# Patient Record
Sex: Female | Born: 1940
Health system: Southern US, Community
[De-identification: ages and names within clinical notes are randomized; demographics above are authoritative.]

## PROBLEM LIST (undated history)

## (undated) DIAGNOSIS — E079 Disorder of thyroid, unspecified: Secondary | ICD-10-CM

## (undated) DIAGNOSIS — G309 Alzheimer's disease, unspecified: Secondary | ICD-10-CM

## (undated) DIAGNOSIS — E119 Type 2 diabetes mellitus without complications: Secondary | ICD-10-CM

## (undated) DIAGNOSIS — E0500 Thyrotoxicosis with diffuse goiter without thyrotoxic crisis or storm: Secondary | ICD-10-CM

## (undated) DIAGNOSIS — F0280 Dementia in other diseases classified elsewhere without behavioral disturbance: Secondary | ICD-10-CM

## (undated) HISTORY — DX: Thyrotoxicosis with diffuse goiter without thyrotoxic crisis or storm: E05.00

## (undated) HISTORY — PX: ABDOMINAL HYSTERECTOMY: SHX81

---

## 1997-08-27 ENCOUNTER — Other Ambulatory Visit: Admission: RE | Admit: 1997-08-27 | Discharge: 1997-08-27 | Payer: Self-pay | Admitting: Obstetrics and Gynecology

## 1998-08-18 ENCOUNTER — Other Ambulatory Visit: Admission: RE | Admit: 1998-08-18 | Discharge: 1998-08-18 | Payer: Self-pay | Admitting: Obstetrics and Gynecology

## 1999-11-01 ENCOUNTER — Other Ambulatory Visit: Admission: RE | Admit: 1999-11-01 | Discharge: 1999-11-01 | Payer: Self-pay | Admitting: Obstetrics and Gynecology

## 2000-11-08 ENCOUNTER — Other Ambulatory Visit: Admission: RE | Admit: 2000-11-08 | Discharge: 2000-11-08 | Payer: Self-pay | Admitting: Obstetrics and Gynecology

## 2001-01-02 HISTORY — PX: APPENDECTOMY: SHX54

## 2001-11-04 ENCOUNTER — Other Ambulatory Visit: Admission: RE | Admit: 2001-11-04 | Discharge: 2001-11-04 | Payer: Self-pay | Admitting: Obstetrics and Gynecology

## 2002-01-24 ENCOUNTER — Encounter: Payer: Self-pay | Admitting: Emergency Medicine

## 2002-01-25 ENCOUNTER — Inpatient Hospital Stay (HOSPITAL_COMMUNITY): Admission: EM | Admit: 2002-01-25 | Discharge: 2002-02-06 | Payer: Self-pay | Admitting: Emergency Medicine

## 2002-01-25 ENCOUNTER — Encounter (INDEPENDENT_AMBULATORY_CARE_PROVIDER_SITE_OTHER): Payer: Self-pay

## 2002-01-25 ENCOUNTER — Encounter: Payer: Self-pay | Admitting: Emergency Medicine

## 2002-01-28 ENCOUNTER — Encounter: Payer: Self-pay | Admitting: Surgery

## 2002-01-28 ENCOUNTER — Encounter: Payer: Self-pay | Admitting: General Surgery

## 2002-01-31 ENCOUNTER — Encounter: Payer: Self-pay | Admitting: Surgery

## 2002-02-03 ENCOUNTER — Encounter: Payer: Self-pay | Admitting: Surgery

## 2002-02-05 ENCOUNTER — Encounter: Payer: Self-pay | Admitting: Surgery

## 2002-12-01 ENCOUNTER — Other Ambulatory Visit: Admission: RE | Admit: 2002-12-01 | Discharge: 2002-12-01 | Payer: Self-pay | Admitting: Obstetrics and Gynecology

## 2003-12-16 ENCOUNTER — Other Ambulatory Visit: Admission: RE | Admit: 2003-12-16 | Discharge: 2003-12-16 | Payer: Self-pay | Admitting: Obstetrics and Gynecology

## 2004-10-31 ENCOUNTER — Encounter: Admission: RE | Admit: 2004-10-31 | Discharge: 2004-10-31 | Payer: Self-pay | Admitting: Obstetrics and Gynecology

## 2005-03-06 ENCOUNTER — Other Ambulatory Visit: Admission: RE | Admit: 2005-03-06 | Discharge: 2005-03-06 | Payer: Self-pay | Admitting: Obstetrics and Gynecology

## 2006-08-08 ENCOUNTER — Ambulatory Visit (HOSPITAL_COMMUNITY): Admission: RE | Admit: 2006-08-08 | Discharge: 2006-08-08 | Payer: Self-pay | Admitting: Gastroenterology

## 2007-11-25 ENCOUNTER — Ambulatory Visit: Payer: Self-pay | Admitting: Cardiovascular Disease

## 2007-12-11 ENCOUNTER — Ambulatory Visit: Payer: Self-pay

## 2007-12-11 ENCOUNTER — Encounter: Payer: Self-pay | Admitting: Cardiovascular Disease

## 2010-01-22 ENCOUNTER — Encounter: Payer: Self-pay | Admitting: Obstetrics and Gynecology

## 2010-05-17 NOTE — Op Note (Signed)
NAMEGERICA, [name]                ACCOUNT NO.:  [id number]   MEDICAL RECORD NO.:  [id number]          PATIENT TYPE:  AMB   LOCATION:  ENDO                         FACILITY:  [facility]   PHYSICIAN:  Anselmo Rod, M.D.  DATE OF BIRTH:  Nov 13, 1940   DATE OF PROCEDURE:  08/08/2006  DATE OF DISCHARGE:                               OPERATIVE REPORT   PROCEDURE PERFORMED:  Screening colonoscopy.   ENDOSCOPIST:  Anselmo Rod, M.D.   INSTRUMENT USED:  Pentax video colonoscope.   INDICATIONS FOR PROCEDURE:  Patient is a 70 year old white female, who  underwent screening colonoscopy to rule out colonic polyps, masses, etc.   PREPROCEDURE PREPARATION:  Informed consent was obtained from the  patient. Patient fasted for eight hours prior to the procedure and  prepped with two Dulcolax pills, a bottle of magnesium citrate and a  gallon of NuLYTELY the night prior to the procedure.  Risks and benefits  of the procedure, including a 10% miss rate of cancer and polyp, were  discussed with the patient, as well.   PHYSICAL EXAM:  Patient had stable vital signs.  NECK:  Supple.  CHEST:  Clear to auscultation.  S1, S2 regular.  ABDOMEN:  Soft with normal bowel sounds.   DESCRIPTION OF PROCEDURE:  The patient was placed in left lateral  decubitus position, sedated with 75 micrograms of Fentanyl and 4 mg of  Versed given intravenously in slow, incremental doses. Once the patient  was adequately sedated and maintained on low-flow oxygen, continuous  cardiac monitoring, the Pentax video colonoscope was advanced from the  rectum to the cecum.  There was some residual stool in the colon.  Multiple washes were done. The appendiceal orifice and ileocecal valve  were visualized and photographed.  The patient's position was changed  from the left lateral to the supine position with gentle application of  abdominal pressure to reach the terminal ileum. The terminal ileum  appeared healthy and without  lesions.  Small internal hemorrhoids were  seen on retroflexion and a small external hemorrhoid was seen on anal  inspection.  A few scattered diverticula were noted.  No masses or  polyps were identified.  The patient tolerated the procedure well,  without complications.   IMPRESSION:  1. Small external hemorrhoid.  2. Small internal hemorrhoids.  3. Few scattered diverticula.  4. Otherwise normal exam, no masses or polyps seen.   RECOMMENDATIONS:  1. Continue high-fiber diet with liberal fluid intake.  2. Repeat colonoscopy in the next ten years.  3. If the patient has any abnormal symptoms in the interim, she should      contact the office immediately for further recommendations.  4. Outpatient followup as need arises in the future.      Anselmo Rod, M.D.  Electronically Signed     JNM/MEDQ  D:  08/08/2006  T:  08/08/2006  Job:  [id number]   cc:   Marcelino Duster L. Vincente Poli, M.D.  Fax: [phone]   [name], M.D.  Fax: [phone]

## 2010-05-17 NOTE — Assessment & Plan Note (Signed)
[facility]                            CARDIOLOGY OFFICE NOTE   NAME:[name] [name] SANTOYO                       MRN:          [id number]  DATE:11/25/2007                            DOB:          July 04, 1940    [name] is a pleasant 70 year old patient referred by Dr. Talmage Nap for  chest pain and shortness of breath.  I have seen the patient back before  in 2002.  She is a longstanding diabetic with hypertension and  hypercholesterolemia.   She has been having exertional chest pain and shortness of breath, it  has been going on for few months.  She initially get the dyspnea.   The dyspnea is clearly exertional.  There is no resting shortness of  breath.  She quit smoking over 20 years ago and does not carry a  diagnosis of lung disease.  She has not had a cough, pleuritic pain, or  sputum production.  There is no history of DVT or PE.  Sometimes, if she  walks she gets a squeezing-type chest pain in her chest after the  dyspnea starts.   It tends to happen when she goes up-hill.   She has not had any prolonged episodes or resting pain.   The patient has actually done quite well with her diabetes.  Byetta has  been a godsend.  She has lost about 50 pounds and her hemoglobin A1c is  down to 5.6.   Despite having her sugar under better control and losing this amount of  weight, she has been having her exertional dyspnea and pressure.  I told  her that with these symptoms she clearly needs a followup stress  Myoview.  Her last study was of good quality and I think it will be a  good baseline.   Review of systems otherwise negative.  In particular, she has not had  diaphoresis, syncope, or palpitations.   Her current medications include metformin 500 b.i.d., Byetta 10 a day,  lisinopril 5 a day, lovastatin 40 a day, an aspirin a day.   She is allergic to ASPIRIN and PENICILLIN.   The patient's past medical history is otherwise remarkable for  diabetes,  hypercholesterolemia, hypertension.  She has had left knee surgery  before.   She is happily married.  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  She otherwise tries to walk on a regular basis.  She does not smoke or drink.   Her family history is noncontributory.   Her exam is remarkable for an overweight short white female in no  distress.  Affect is appropriate.  Her blood pressure is 130/80, pulse  70 and regular, respiratory rate 14 afebrile.  HEENT, unremarkable.  Carotids are normal without bruit.  No lymphadenopathy, thyromegaly, or  JVP elevation.  Lungs are clear, good diaphragmatic motion.  No  wheezing.  S1 and S2 with normal heart sounds.  PMI normal.  Abdomen is  benign.  Bowel sounds positive.  No AAA, no tenderness, no bruit, no  hepatosplenomegaly, no hepatojugular reflux.  No tenderness.  Distal  pulses are intact.  No edema.  Neuro nonfocal.  Skin, warm and dry.  No  muscular weakness.   EKG shows sinus rhythm with some minor nonspecific T-wave changes in the  inferior leads.   IMPRESSION:  1. Multiple coronary risk factors with exertional pressure and      shortness of breath.  Followup stress Myoview.  2. Exertional dyspnea in the setting of multiple coronary risk factors      and hypertension.  Check 2-D echocardiogram.  Assess left      ventricular thickness and left ventricular mass.  Rule out      diastolic or systolic dysfunction.  3. Hypercholesterolemia.  Her LDL cholesterol was 86 with normal LFTs.      Continue lovastatin.  Lipid and liver profile in 6 months.  4. Hypertension, currently well controlled.  Continue low-dose      lisinopril.  5. Diabetes, very well controlled with Byetta.  Continue to do b.i.d.      injections.  Follow up with Dr. Talmage Nap.   Further recommendations will be based on results of her stress test and  echo.  Hopefully, they will be normal as they were in 2002.     Noralyn Pick. Eden Emms,  MD, [facility]  Electronically Signed    PCN/MedQ  DD: 11/25/2007  DT: 11/26/2007  Job #: [id number]   cc:   [name], M.D.

## 2010-05-20 NOTE — Consult Note (Signed)
NAMEMIRABEL, AHLGREN                          ACCOUNT NO.:  [id number]   MEDICAL RECORD NO.:  [id number]                   PATIENT TYPE:  INP   LOCATION:  0353                                 FACILITY:  [facility]   PHYSICIAN:  Reather Littler, M.D.                    DATE OF BIRTH:  1940/03/22   DATE OF CONSULTATION:  02/01/2002  DATE OF DISCHARGE:                                   CONSULTATION   REASON FOR CONSULTATION:  Diabetes.   HISTORY OF PRESENT ILLNESS:  This is a 70 year old Caucasian female admitted  on 01/24/02, for acute appendicitis.  The patient apparently has not been  told to have diabetes in the past, and her blood sugar on admission was 178.  Repeat blood sugar on 01/26/02, was 198.  On 01/31/02, was 318.  The patient  did receive a single bolus of Solu-Medrol on 01/28/02, of 125 mg.  Subsequently, has not had any further steroids.  She was also changed from  Cipro to Tequin on 01/28/02.   The patient has had continued problems with mild fever and leukocytosis, and  did have a CT scan of her abdomen on 02/01/02, with drainage of fluid  collection.   CURRENT MEDICATIONS:  1. Estradiol.  2. Levothyroxine 0.1 mg.  3. Protonix 40 mg b.i.d.  4. Albuterol.  5. Cough medications.  6. Intravenous Flagyl and Tequin.  7. Pain medications p.r.n.   ALLERGIES:  PENICILLIN.   PAST MEDICAL HISTORY:  1. The patient has a history of hypothyroidism secondary to radioactive     iodine ablation.  Her office history and examination is attached to the     chart.  The patient has been on Synthroid replacement with recent normal     levels.  2. History of mild hypertension, taking Norvasc.  3. Hysterectomy 15 years ago, and is maintained on Estradiol.   REVIEW OF SYMPTOMS:  Essentially unremarkable apart from above history.   PHYSICAL EXAMINATION:  GENERAL:  The patient was somewhat lethargic on  initial evaluation.  Was not cooperative for full examination.  HEENT:  Grossly normal.  NECK:  No thyroid enlargement, no lymphadenopathy.  HEART:  Heart sounds normal.  LUNGS:  Clear.  ABDOMEN:  Mildly distended.  Bandage present and no obvious tenderness  present.  Bowel sounds markedly decreased.  EXTREMITIES:  Normal.  Pedal pulses appeared decreased.   ASSESSMENT:  The patient does have mild diabetes with admission blood sugars  near 200.  However, it is difficult to assess whether she has true diabetes  as she had recent steroids, and her blood sugar possible elevated as a  consequence of this.  There is also a remote chance that Tequin may have  increased her blood sugars.   PLAN:  We will start intravenous insulin, continue IV hydration, and  eventually switch her to oral agents.  Her Synthroid will be continued  as  such.                                                 Reather Littler, M.D.    AK/MEDQ  D:  02/01/2002  T:  02/01/2002  Job:  [id number]   cc:   Velora Heckler, M.D.  [address] [place]  [place] [address]  Fax: [phone]   Alfonse Alpers. Dagoberto Ligas, M.D.  [address], [address]  [place]  [place] [address]  Fax: [phone]

## 2010-05-20 NOTE — Discharge Summary (Signed)
[name], [name]                          ACCOUNT NO.:  [id number]   MEDICAL RECORD NO.:  [id number]                   PATIENT TYPE:  INP   LOCATION:  0353                                 FACILITY:  [facility]   PHYSICIAN:  Velora Heckler, M.D.                DATE OF BIRTH:  [birth year]   DATE OF ADMISSION:  01/24/2002  DATE OF DISCHARGE:  02/06/2002                                 DISCHARGE SUMMARY   REASON FOR ADMISSION:  Acute appendicitis.   BRIEF HISTORY:  The patient is a 70 year old white female from [place],  [place].  Presents to the emergency department at [facility] with three day history of abdominal pain localized to the  right lower quadrant associated with nausea and vomiting.  The patient was  noted to have an elevated white blood cell count of 14,000.  CT scan abdomen  and pelvis showed findings consistent with acute appendicitis.   HOSPITAL COURSE:  The patient was seen in the emergency department in  consultation by general surgery.  She was admitted on the general surgical  service.  The patient was prepared and taken directly to the operating room  where she underwent laparoscopic appendectomy.  The patient was noted to  have moderate intra-abdominal fluid, peritonitis, and a gangrenous appendix.  She was treated postoperatively with intravenous antibiotics.  Postoperative  course was complicated by respiratory distress.  She was transferred to the  stepdown unit and required supplemental oxygen.  She was seen in  consultation by Casimiro Needle B. Sherene Sires, M.D. [facility] from the pulmonary medical  service.  She was treated with intravenous steroids and a change in her  antibiotic therapy as well as bronchodilators.  The patient improved.  However, she maintained a low grade fever and leukocytosis.  She had poor  appetite, persistent abdominal discomfort.  The patient underwent CT  scanning of the abdomen and pelvis to rule out abscess.  She was  found to  have a significant pericolonic fluid collection which was percutaneously  drained with CT guidance.  The patient continued on intravenous antibiotics.  Also complicating her postoperative recovery was new onset diabetes.  She  was seen in consultation by Reather Littler, M.D. and her primary physician,  Alfonse Alpers. Dagoberto Ligas, M.D.  She was treated with oral hypoglycemics and  supplemental insulin.  Over the ensuing days the patient gradually improved.  She remained on intravenous antibiotics throughout her hospital course.  She  was converted late in the course to oral Cipro.  Sequential CT scanning  showed resolution of the intra-abdominal fluid collection.  Cultures of the  fluid failed to grow any significant organisms.  Therefore, the drain was  removed on February 4.  The patient was converted to oral hypoglycemics.  She was prepared for discharge home on February 06, 2002.   DISCHARGE PLAN:  The patient is discharged home February 06, 2002.  DISCHARGE MEDICATIONS:  1. Estradiol.  2. Synthroid.  3. Nystatin.  4. Glucotrol.  5. Cipro.  6. Actos.  7. Vicodin as needed for pain.  8. Magic mouthwash.   FOLLOW UP:  The patient will be seen back in my office at [facility] in 10 days for a wound check.  The patient will be seen back at the  office of Alfonse Alpers. [name], M.D. in five days for follow-up of her  diabetes.   FINAL DIAGNOSES:  1. Acute appendicitis with peritonitis.  2. New onset diabetes.  3. Postoperative fluid collection requiring percutaneous drainage.   CONDITION ON DISCHARGE:  Improved.                                               Velora Heckler, M.D.    TMG/MEDQ  D:  02/06/2002  T:  02/06/2002  Job:  [id number]   cc:   Alfonse Alpers. Dagoberto Ligas, M.D.  [address]  [place]  [place] [address]  Fax: [phone]   Stann Mainland. Vincente Poli, M.D.  [address], [address] [place]  [place] [address]  Fax: [phone]

## 2010-05-20 NOTE — H&P (Signed)
[name], [name]                          ACCOUNT NO.:  [id number]   MEDICAL RECORD NO.:  [id number]                   PATIENT TYPE:  INP   LOCATION:  0346                                 FACILITY:  [facility]   PHYSICIAN:  Velora Heckler, M.D.                DATE OF BIRTH:  03/30/1940   DATE OF ADMISSION:  01/24/2002  DATE OF DISCHARGE:                                HISTORY & PHYSICAL   PRIMARY CARE PHYSICIAN:  Alfonse Alpers. Dagoberto Ligas, M.D.   GYNECOLOGISTStann Mainland. Vincente Poli, M.D.   REFERRING PHYSICIAN:  Markham Jordan L. Effie Shy, M.D., emergency department at [facility].   CHIEF COMPLAINT:  Acute appendicitis.   HISTORY OF PRESENT ILLNESS:  The patient is a 70 year old white female from  [place], [place], presents with a three day history of abdominal pain,  localizing to the right lower quadrant associated with nausea and vomiting.  The patient developed initially diffuse abdominal pain.  Her family thought  that she had pulled a muscle with her exercise routine.  However, pain  persisted and localized to the right lower quadrant.  The patient developed  fever to 102.  She did have onset of nausea and vomiting on 01/24/02.  The  patient was brought to the emergency department at [facility] in  the evening of 01/24/02.  The patient was assessed by Dr. Gray Bernhardt.  Laboratory studies showed an elevated white blood cell count at 14,000 with  a left shift.  CT scan of abdomen and pelvis was obtained which demonstrated  findings consistent with acute appendicitis.  General surgery was consulted,  and the patient is admitted at this time for further evaluation and  management.   PAST MEDICAL HISTORY:  1. Status post total vaginal hysterectomy.  2. History of Graves disease, treated with radioactive iodine.  3. History of hypertension.   MEDICATIONS:  1. Synthroid.  2. Estrace.  3. Norvasc.   ALLERGIES:  1. PENICILLIN, causing itching.  2. ASPIRIN, causing rash.   SOCIAL HISTORY:  The patient lives in [place], [place].  She is accompanied by  her husband.  She denies tobacco use, having quit approximately 30 years  ago.  She denies alcohol use.   FAMILY HISTORY:  No adverse reactions to surgery or anesthesia.   REVIEW OF SYMPTOMS:  A 15 system review.  Discussed with the patient and  family without significant positives except as noted above.   PHYSICAL EXAMINATION:  GENERAL:  A 70 year old moderately obese white female  on a stretcher in the emergency department, sedated.  VITAL SIGNS:  Temperature 102.8, pulse 128, respirations 24, blood pressure  154/76, O2 saturation 98% on room air.  HEENT:  Normocephalic.  Sclerae are clear.  Dentition is fair.  Voice  quality is normal.  NECK:  Anterior examination of the neck shows it to be symmetric.  Palpation  reveals no mass.  There  is no anterior or posterior cervical adenopathy.  There are no supraclavicular masses.  LUNGS:  Clear to auscultation.  CARDIAC:  Regular rate and rhythm without murmur.  ABDOMEN:  Soft, obese, quiet.  There are no obvious surgical wounds.  There  is tenderness to palpation diffusely across the abdominal wall with maximum  in the right lower quadrant.  There is tenderness to percussion particularly  at McBurney's point.  There is voluntary guarding.  There is rebound  tenderness in the right lower quadrant.  EXTREMITIES:  Nontender with 1+ edema at the ankles.  NEUROLOGIC:  The patient is alert and oriented without focal neurologic  deficits.   LABORATORY DATA:  CT scan of the abdomen and pelvis read by Dr. Loralie Champagne shows dilated appendix with surrounding inflammation consistent  with acute appendicitis.  Laboratory studies show a white blood cell count  of 14.7, hemoglobin 12.7, hematocrit 37%, platelet count 241,000.  Differential shows 82% neutrophils, 11% lymphocytes, 6% monocytes.  Urinalysis was benign.  Amylase is 60, lipase is 20.  Chemistry profile is   within normal limits with the exception of glucose of 178.  Liver function  tests are normal.  Prothrombin time is normal at 13.2, PTT is normal at 30  seconds.   DIAGNOSIS:  Acute appendicitis.   PLAN:  I discussed at length with the patient and her family the indications  for surgery.  I explained the technique of laparoscopic appendectomy versus  conversion to an open appendectomy.  We discussed the hospital course to be  expected.  We discussed potential complications including conversion to an  open procedure, and possibility of wound infection or intra-abdominal  abscess formation.  The patient and her family understand and wish to  proceed.  We will make arrangements with the operating room here at [facility] on an urgent basis.                                                 Velora Heckler, M.D.    TMG/MEDQ  D:  01/25/2002  T:  01/25/2002  Job:  [id number]   cc:   Alfonse Alpers. Dagoberto Ligas, M.D.  [address], [address]  [place]  [place] [address]  Fax: [phone]   Stann Mainland. Vincente Poli, M.D.  [address], [address] [place]  [place] [address]  Fax: [phone]

## 2010-05-20 NOTE — Op Note (Signed)
NAMEKEARY, [name]                          ACCOUNT NO.:  [id number]   MEDICAL RECORD NO.:  [id number]                   PATIENT TYPE:  INP   LOCATION:  0346                                 FACILITY:  [facility]   PHYSICIAN:  Velora Heckler, M.D.                DATE OF BIRTH:  01-11-1940   DATE OF PROCEDURE:  01/25/2002  DATE OF DISCHARGE:                                 OPERATIVE REPORT   PREOPERATIVE DIAGNOSIS:  Acute appendicitis.   POSTOPERATIVE DIAGNOSIS:  Acute appendicitis.   PROCEDURE:  Laparoscopic cholecystectomy.   SURGEON:  Velora Heckler, M.D.   ANESTHESIA:  General.   ESTIMATED BLOOD LOSS:  Minimal.   PREPARATION:  Betadine.   INDICATIONS:  The patient is a 70 year old white female who presents to the  emergency department with a three day history of abdominal pain localized to  the right lower quadrant.  The patient had fever to 102 degrees.  White  blood cell count was elevated at 14,000.  CT scan of the abdomen  demonstrated consistent with acute appendicitis.  The patient was prepared  and now brought to the operating room for appendectomy.   DESCRIPTION OF PROCEDURE:  The procedure was done in OR #1 at the [facility].  The patient was brought to the operating room and  placed in the supine position on the operating room table.  Following the  administration of general anesthesia, the patient was prepped and draped in  the usual strict aseptic fashion.  After ascertaining that an adequate level  of anesthesia had been obtained, a supraumbilical incision was made with the  #15 blade.  Dissection was carried down to the fascia.  The fascia was  incised in the midline and the peritoneal cavity was entered cautiously.  A  0 Vicryl pursestring suture was placed in the fascia.  A Hasson cannula was  introduced under direct vision and secured with a pursestring suture.  The  abdomen was insufflated with carbon dioxide.  The laparoscope was  introduced  under direct vision and the abdomen explored.  There is a large phlegmon in  the right lower quadrant.  There are adhesions of small bowel.  There is  purulent fluid visible.  There is moderate fluid in the pelvis.  Operative  port is placed in the right upper quadrant.  Using a Glassman clamp, loops  of small bowel loops of small bowel were gently dissected away from the  inflammatory mass.  The cecum is exposed.  The tip of what appears to be a  necrotic appendix is identified.  A second operative port is placed in the  left lower quadrant.  The terminal ileum is identified.  A necrotic piece of  adipose tissue overlying the appendix is dissected away with the harmonic  scalpel.  The base of the appendix was visualized.  A window was created  behind  the base of the appendix.  An Endo-GIA stapler was inserted and used  to transect the base of the appendix at the cecal wall.  The appendiceal  mesentery was then taken down using the Harmonic scalpel for hemostasis.  The entire appendix was excised and placed into an EndoCatch bag.  A small  piece of necrotic adipose tissue was also completely excised and placed into  the EndoCatch bag.  Specimen was withdrawn through the left lower quadrant  port.  The right lower quadrant was then copiously irrigated with warm  saline which was evacuated.  The pelvis was irrigated with warm saline which  was evacuated.  Good hemostasis was noted.  The ports were removed under  direct vision.  Pneumoperitoneum was released.  Ports were removed.  0  Vicryl pursestring sutures were tied securely.  The port sites were  anesthetized with local anesthetic.  All three wounds were closed with  interrupted 4-0 Vicryl subcuticular sutures.  The wounds were washed and  dried, and Benzoin and Steri-Strips were applied.  Sterile gauze dressings  were applied.  The patient was awakened from anesthesia and brought to the  recovery room in stable condition.   The patient tolerated the procedure  well.                                                Velora Heckler, M.D.    TMG/MEDQ  D:  01/25/2002  T:  01/25/2002  Job:  [id number]   cc:   Alfonse Alpers. Dagoberto Ligas, M.D.  [address], [address]  [place]  [place] [address]  Fax: [phone]   Stann Mainland. Vincente Poli, M.D.  [address] [place]  [place] [address]  Fax: [phone]   Jonita Albee, M.D.  [facility]  [address]  [place]  [place] [address]  Fax: [phone]

## 2010-08-31 ENCOUNTER — Other Ambulatory Visit: Payer: Self-pay | Admitting: Neurology

## 2010-08-31 DIAGNOSIS — F068 Other specified mental disorders due to known physiological condition: Secondary | ICD-10-CM

## 2010-09-02 ENCOUNTER — Ambulatory Visit
Admission: RE | Admit: 2010-09-02 | Discharge: 2010-09-02 | Disposition: A | Discharge status: Home / Self Care | Payer: Medicare Other | Source: Ambulatory Visit | Attending: Neurology | Admitting: Neurology

## 2010-09-02 DIAGNOSIS — F068 Other specified mental disorders due to known physiological condition: Secondary | ICD-10-CM

## 2011-07-26 ENCOUNTER — Other Ambulatory Visit: Payer: Self-pay | Admitting: Obstetrics and Gynecology

## 2012-07-29 ENCOUNTER — Other Ambulatory Visit: Payer: Self-pay | Admitting: Obstetrics and Gynecology

## 2014-01-06 DIAGNOSIS — E1121 Type 2 diabetes mellitus with diabetic nephropathy: Secondary | ICD-10-CM | POA: Diagnosis not present

## 2014-01-06 DIAGNOSIS — I10 Essential (primary) hypertension: Secondary | ICD-10-CM | POA: Diagnosis not present

## 2014-01-06 DIAGNOSIS — E538 Deficiency of other specified B group vitamins: Secondary | ICD-10-CM | POA: Diagnosis not present

## 2014-01-13 DIAGNOSIS — E1165 Type 2 diabetes mellitus with hyperglycemia: Secondary | ICD-10-CM | POA: Diagnosis not present

## 2014-01-13 DIAGNOSIS — E780 Pure hypercholesterolemia: Secondary | ICD-10-CM | POA: Diagnosis not present

## 2014-01-13 DIAGNOSIS — E039 Hypothyroidism, unspecified: Secondary | ICD-10-CM | POA: Diagnosis not present

## 2014-01-13 DIAGNOSIS — E1121 Type 2 diabetes mellitus with diabetic nephropathy: Secondary | ICD-10-CM | POA: Diagnosis not present

## 2014-02-20 DIAGNOSIS — H2513 Age-related nuclear cataract, bilateral: Secondary | ICD-10-CM | POA: Diagnosis not present

## 2014-02-20 DIAGNOSIS — E119 Type 2 diabetes mellitus without complications: Secondary | ICD-10-CM | POA: Diagnosis not present

## 2014-02-20 DIAGNOSIS — H524 Presbyopia: Secondary | ICD-10-CM | POA: Diagnosis not present

## 2014-03-24 DIAGNOSIS — E1165 Type 2 diabetes mellitus with hyperglycemia: Secondary | ICD-10-CM | POA: Diagnosis not present

## 2014-03-24 DIAGNOSIS — E039 Hypothyroidism, unspecified: Secondary | ICD-10-CM | POA: Diagnosis not present

## 2014-03-24 DIAGNOSIS — E780 Pure hypercholesterolemia: Secondary | ICD-10-CM | POA: Diagnosis not present

## 2014-03-24 DIAGNOSIS — I10 Essential (primary) hypertension: Secondary | ICD-10-CM | POA: Diagnosis not present

## 2014-03-26 DIAGNOSIS — E780 Pure hypercholesterolemia: Secondary | ICD-10-CM | POA: Diagnosis not present

## 2014-03-26 DIAGNOSIS — N182 Chronic kidney disease, stage 2 (mild): Secondary | ICD-10-CM | POA: Diagnosis not present

## 2014-03-26 DIAGNOSIS — E1121 Type 2 diabetes mellitus with diabetic nephropathy: Secondary | ICD-10-CM | POA: Diagnosis not present

## 2014-03-26 DIAGNOSIS — I10 Essential (primary) hypertension: Secondary | ICD-10-CM | POA: Diagnosis not present

## 2014-04-22 DIAGNOSIS — Z1231 Encounter for screening mammogram for malignant neoplasm of breast: Secondary | ICD-10-CM | POA: Diagnosis not present

## 2014-04-22 DIAGNOSIS — Z124 Encounter for screening for malignant neoplasm of cervix: Secondary | ICD-10-CM | POA: Diagnosis not present

## 2014-09-21 DIAGNOSIS — E1165 Type 2 diabetes mellitus with hyperglycemia: Secondary | ICD-10-CM | POA: Diagnosis not present

## 2014-09-21 DIAGNOSIS — N182 Chronic kidney disease, stage 2 (mild): Secondary | ICD-10-CM | POA: Diagnosis not present

## 2014-09-21 DIAGNOSIS — E780 Pure hypercholesterolemia: Secondary | ICD-10-CM | POA: Diagnosis not present

## 2014-09-21 DIAGNOSIS — E039 Hypothyroidism, unspecified: Secondary | ICD-10-CM | POA: Diagnosis not present

## 2014-09-28 DIAGNOSIS — E780 Pure hypercholesterolemia: Secondary | ICD-10-CM | POA: Diagnosis not present

## 2014-09-28 DIAGNOSIS — E1165 Type 2 diabetes mellitus with hyperglycemia: Secondary | ICD-10-CM | POA: Diagnosis not present

## 2014-09-28 DIAGNOSIS — E039 Hypothyroidism, unspecified: Secondary | ICD-10-CM | POA: Diagnosis not present

## 2014-09-28 DIAGNOSIS — I10 Essential (primary) hypertension: Secondary | ICD-10-CM | POA: Diagnosis not present

## 2015-02-22 DIAGNOSIS — H52223 Regular astigmatism, bilateral: Secondary | ICD-10-CM | POA: Diagnosis not present

## 2015-02-22 DIAGNOSIS — H2513 Age-related nuclear cataract, bilateral: Secondary | ICD-10-CM | POA: Diagnosis not present

## 2015-02-22 DIAGNOSIS — E119 Type 2 diabetes mellitus without complications: Secondary | ICD-10-CM | POA: Diagnosis not present

## 2015-02-22 DIAGNOSIS — H5060 Mechanical strabismus, unspecified: Secondary | ICD-10-CM | POA: Diagnosis not present

## 2015-02-24 DIAGNOSIS — R05 Cough: Secondary | ICD-10-CM | POA: Diagnosis not present

## 2015-03-08 DIAGNOSIS — E039 Hypothyroidism, unspecified: Secondary | ICD-10-CM | POA: Diagnosis not present

## 2015-03-08 DIAGNOSIS — E1165 Type 2 diabetes mellitus with hyperglycemia: Secondary | ICD-10-CM | POA: Diagnosis not present

## 2015-03-08 DIAGNOSIS — E7800 Pure hypercholesterolemia, unspecified: Secondary | ICD-10-CM | POA: Diagnosis not present

## 2015-03-15 DIAGNOSIS — I10 Essential (primary) hypertension: Secondary | ICD-10-CM | POA: Diagnosis not present

## 2015-03-15 DIAGNOSIS — E7800 Pure hypercholesterolemia, unspecified: Secondary | ICD-10-CM | POA: Diagnosis not present

## 2015-03-15 DIAGNOSIS — E039 Hypothyroidism, unspecified: Secondary | ICD-10-CM | POA: Diagnosis not present

## 2015-03-15 DIAGNOSIS — E1165 Type 2 diabetes mellitus with hyperglycemia: Secondary | ICD-10-CM | POA: Diagnosis not present

## 2015-04-26 DIAGNOSIS — Z1272 Encounter for screening for malignant neoplasm of vagina: Secondary | ICD-10-CM | POA: Diagnosis not present

## 2015-04-26 DIAGNOSIS — Z90710 Acquired absence of both cervix and uterus: Secondary | ICD-10-CM | POA: Diagnosis not present

## 2015-04-26 DIAGNOSIS — Z1231 Encounter for screening mammogram for malignant neoplasm of breast: Secondary | ICD-10-CM | POA: Diagnosis not present

## 2015-04-26 DIAGNOSIS — Z6839 Body mass index (BMI) 39.0-39.9, adult: Secondary | ICD-10-CM | POA: Diagnosis not present

## 2015-04-26 DIAGNOSIS — Z01419 Encounter for gynecological examination (general) (routine) without abnormal findings: Secondary | ICD-10-CM | POA: Diagnosis not present

## 2015-05-16 DIAGNOSIS — L259 Unspecified contact dermatitis, unspecified cause: Secondary | ICD-10-CM | POA: Diagnosis not present

## 2015-06-08 ENCOUNTER — Emergency Department (HOSPITAL_COMMUNITY): Payer: Medicare Other

## 2015-06-08 ENCOUNTER — Emergency Department (HOSPITAL_COMMUNITY)
Admission: EM | Admit: 2015-06-08 | Discharge: 2015-06-08 | Disposition: A | Discharge status: Home / Self Care | Payer: Medicare Other | Attending: Emergency Medicine | Admitting: Emergency Medicine

## 2015-06-08 ENCOUNTER — Encounter (HOSPITAL_COMMUNITY): Payer: Self-pay | Admitting: *Deleted

## 2015-06-08 DIAGNOSIS — E119 Type 2 diabetes mellitus without complications: Secondary | ICD-10-CM | POA: Diagnosis not present

## 2015-06-08 DIAGNOSIS — Z880 Allergy status to penicillin: Secondary | ICD-10-CM | POA: Diagnosis not present

## 2015-06-08 DIAGNOSIS — Z87891 Personal history of nicotine dependence: Secondary | ICD-10-CM | POA: Insufficient documentation

## 2015-06-08 DIAGNOSIS — Z9049 Acquired absence of other specified parts of digestive tract: Secondary | ICD-10-CM | POA: Diagnosis not present

## 2015-06-08 DIAGNOSIS — G309 Alzheimer's disease, unspecified: Secondary | ICD-10-CM | POA: Diagnosis not present

## 2015-06-08 DIAGNOSIS — R109 Unspecified abdominal pain: Secondary | ICD-10-CM | POA: Diagnosis present

## 2015-06-08 DIAGNOSIS — N200 Calculus of kidney: Secondary | ICD-10-CM | POA: Diagnosis not present

## 2015-06-08 DIAGNOSIS — F0280 Dementia in other diseases classified elsewhere without behavioral disturbance: Secondary | ICD-10-CM | POA: Diagnosis not present

## 2015-06-08 DIAGNOSIS — K5792 Diverticulitis of intestine, part unspecified, without perforation or abscess without bleeding: Secondary | ICD-10-CM | POA: Diagnosis not present

## 2015-06-08 HISTORY — DX: Type 2 diabetes mellitus without complications: E11.9

## 2015-06-08 HISTORY — DX: Dementia in other diseases classified elsewhere, unspecified severity, without behavioral disturbance, psychotic disturbance, mood disturbance, and anxiety: F02.80

## 2015-06-08 HISTORY — DX: Alzheimer's disease, unspecified: G30.9

## 2015-06-08 HISTORY — DX: Disorder of thyroid, unspecified: E07.9

## 2015-06-08 LAB — COMPREHENSIVE METABOLIC PANEL
ALT: 27 U/L (ref 14–54)
AST: 19 U/L (ref 15–41)
Albumin: 4 g/dL (ref 3.5–5.0)
Alkaline Phosphatase: 37 U/L — ABNORMAL LOW (ref 38–126)
Anion gap: 10 (ref 5–15)
BUN: 15 mg/dL (ref 6–20)
CO2: 23 mmol/L (ref 22–32)
Calcium: 9.5 mg/dL (ref 8.9–10.3)
Chloride: 103 mmol/L (ref 101–111)
Creatinine, Ser: 1.08 mg/dL — ABNORMAL HIGH (ref 0.44–1.00)
GFR calc Af Amer: 56 mL/min — ABNORMAL LOW (ref >60)
GFR calc non Af Amer: 49 mL/min — ABNORMAL LOW (ref >60)
Glucose, Bld: 219 mg/dL — ABNORMAL HIGH (ref 65–99)
Potassium: 4.5 mmol/L (ref 3.5–5.1)
Sodium: 136 mmol/L (ref 135–145)
Total Bilirubin: 0.4 mg/dL (ref 0.3–1.2)
Total Protein: 7.5 g/dL (ref 6.5–8.1)

## 2015-06-08 LAB — URINALYSIS, ROUTINE W REFLEX MICROSCOPIC
Bilirubin Urine: NEGATIVE
Glucose, UA: NEGATIVE mg/dL
Hgb urine dipstick: NEGATIVE
Ketones, ur: NEGATIVE mg/dL
Leukocytes, UA: NEGATIVE
Nitrite: NEGATIVE
Protein, ur: NEGATIVE mg/dL
Specific Gravity, Urine: 1.018 (ref 1.005–1.030)
pH: 7 (ref 5.0–8.0)

## 2015-06-08 LAB — CBC
HCT: 34.3 % — ABNORMAL LOW (ref 36.0–46.0)
Hemoglobin: 10.8 g/dL — ABNORMAL LOW (ref 12.0–15.0)
MCH: 29.3 pg (ref 26.0–34.0)
MCHC: 31.5 g/dL (ref 30.0–36.0)
MCV: 93.2 fL (ref 78.0–100.0)
Platelets: 224 10*3/uL (ref 150–400)
RBC: 3.68 MIL/uL — ABNORMAL LOW (ref 3.87–5.11)
RDW: 13.9 % (ref 11.5–15.5)
WBC: 14.5 10*3/uL — ABNORMAL HIGH (ref 4.0–10.5)

## 2015-06-08 LAB — I-STAT CG4 LACTIC ACID, ED: Lactic Acid, Venous: 1.63 mmol/L (ref 0.5–2.0)

## 2015-06-08 LAB — LIPASE, BLOOD: Lipase: 55 U/L — ABNORMAL HIGH (ref 11–51)

## 2015-06-08 MED ORDER — METRONIDAZOLE 500 MG PO TABS
500.0000 mg | ORAL_TABLET | Freq: Once | ORAL | Status: AC
  Administered 2015-06-08: 500 mg via ORAL
  Filled 2015-06-08: qty 1

## 2015-06-08 MED ORDER — SODIUM CHLORIDE 0.9 % IV BOLUS (SEPSIS)
1000.0000 mL | Freq: Once | INTRAVENOUS | Status: AC
  Administered 2015-06-08: 1000 mL via INTRAVENOUS

## 2015-06-08 MED ORDER — CIPROFLOXACIN HCL 500 MG PO TABS: 500.0000 mg | ORAL_TABLET | Freq: Two times a day (BID) | ORAL | Status: DC

## 2015-06-08 MED ORDER — CIPROFLOXACIN HCL 500 MG PO TABS
500.0000 mg | ORAL_TABLET | Freq: Once | ORAL | Status: AC
  Administered 2015-06-08: 500 mg via ORAL
  Filled 2015-06-08: qty 1

## 2015-06-08 MED ORDER — METRONIDAZOLE 500 MG PO TABS: 500.0000 mg | ORAL_TABLET | Freq: Two times a day (BID) | ORAL | Status: DC

## 2015-06-08 MED ORDER — IOPAMIDOL (ISOVUE-300) INJECTION 61%
INTRAVENOUS | Status: AC
  Administered 2015-06-08: 90 mL
  Filled 2015-06-08: qty 100

## 2015-06-08 MED ORDER — ACETAMINOPHEN 325 MG PO TABS
650.0000 mg | ORAL_TABLET | Freq: Once | ORAL | Status: AC
  Administered 2015-06-08: 650 mg via ORAL
  Filled 2015-06-08: qty 2

## 2015-06-08 MED ORDER — HYDROCODONE-ACETAMINOPHEN 5-325 MG PO TABS: 1.0000 | ORAL_TABLET | Freq: Four times a day (QID) | ORAL | Status: DC | PRN

## 2015-06-08 NOTE — ED Provider Notes (Signed)
CSN: [id number]     Arrival date & time 06/08/15  1657 History   First MD Initiated Contact with Patient 06/08/15 1859     Chief Complaint  Patient presents with  . Abdominal Pain  . Gastroesophageal Reflux  . Fever     (Consider location/radiation/quality/duration/timing/severity/associated sxs/prior Treatment) HPI  75 year old female with a history of diabetes and Alzheimer's presents with abdominal pain and fever. This all started suddenly earlier this afternoon. Temperature at home was 102. Has not been given any antipyretics. Patient was stating that the pain was worse on her right abdomen. No back pain. No urinary symptoms. No reports of diarrhea. Patient went to urgent care and was sent here. She has had a previous abdominal hysterectomy and appendectomy. Currently denies pain unless he is palpated. Declines pain medicine. Family at bedside provides a lot of the history.  Past Medical History  Diagnosis Date  . Diabetes mellitus without complication (HCC)   . Alzheimer's dementia   . Thyroid disease    Past Surgical History  Procedure Laterality Date  . Appendectomy  2003  . Abdominal hysterectomy     No family history on file. Social History  Substance Use Topics  . Smoking status: Former Games developermoker  . Smokeless tobacco: None  . Alcohol Use: No   OB History    No data available     Review of Systems  Constitutional: Positive for fever.  Respiratory: Negative for cough and shortness of breath.   Gastrointestinal: Positive for abdominal pain. Negative for nausea, vomiting and diarrhea.  Genitourinary: Negative for dysuria.  Musculoskeletal: Negative for back pain.  All other systems reviewed and are negative.     Allergies  Penicillins and Sulfa antibiotics  Home Medications   Prior to Admission medications   Not on File   BP 156/98 mmHg  Pulse 100  Resp 22  SpO2 100% Physical Exam  Constitutional: She is oriented to person, place, and time. She appears  well-developed and well-nourished.  HENT:  Head: Normocephalic and atraumatic.  Right Ear: External ear normal.  Left Ear: External ear normal.  Nose: Nose normal.  Eyes: Right eye exhibits no discharge. Left eye exhibits no discharge.  Cardiovascular: Normal rate, regular rhythm and normal heart sounds.   Pulmonary/Chest: Effort normal and breath sounds normal.  Abdominal: Soft. She exhibits no distension. There is tenderness in the right lower quadrant and left lower quadrant. There is no CVA tenderness.  Neurological: She is alert and oriented to person, place, and time.  Skin: Skin is warm and dry.  Nursing note and vitals reviewed.   ED Course  Procedures (including critical care time) Labs Review Labs Reviewed  LIPASE, BLOOD - Abnormal; Notable for the following:    Lipase 55 (*)    All other components within normal limits  COMPREHENSIVE METABOLIC PANEL - Abnormal; Notable for the following:    Glucose, Bld 219 (*)    Creatinine, Ser 1.08 (*)    Alkaline Phosphatase 37 (*)    GFR calc non Af Amer 49 (*)    GFR calc Af Amer 56 (*)    All other components within normal limits  CBC - Abnormal; Notable for the following:    WBC 14.5 (*)    RBC 3.68 (*)    Hemoglobin 10.8 (*)    HCT 34.3 (*)    All other components within normal limits  URINALYSIS, ROUTINE W REFLEX MICROSCOPIC (NOT AT [facility])  I-STAT CG4 LACTIC ACID, ED    Imaging  Review Ct Abdomen Pelvis W Contrast  06/08/2015  CLINICAL DATA:  Right lower quadrant pain for several days, history of prior hysterectomy and appendectomy complicated by abscess. EXAM: CT ABDOMEN AND PELVIS WITH CONTRAST TECHNIQUE: Multidetector CT imaging of the abdomen and pelvis was performed using the standard protocol following bolus administration of intravenous contrast. CONTRAST:  90mL ISOVUE-300 IOPAMIDOL (ISOVUE-300) INJECTION 61% COMPARISON:  None. FINDINGS: Lung bases are free of acute infiltrate or sizable effusion. The liver is  diffusely fatty infiltrated. The gallbladder, spleen, adrenal glands and pancreas are within normal limits. Kidneys are well visualized bilaterally. Tiny 1 mm stone is noted in the upper pole of the right kidney. No definitive ureteral stones are noted. The bladder is partially distended. The appendix is not visualize consistent with a prior surgical history. A small periumbilical fat containing hernia is noted. Diffuse aorta iliac calcifications are seen without aneurysmal dilatation. There are changes consistent with diverticular disease with significant wall thickening in the sigmoid and pericolonic inflammatory changes consistent with diverticulitis. No definitive perforation is identified. No abscess formation is seen at this time. No free pelvic fluid is seen. The uterus has been surgically removed. The bony structures show degenerative change of the lumbar spine. IMPRESSION: Changes consistent with diverticulitis without definitive abscess or perforation. Tiny 1 mm nonobstructing right renal stone. Electronically Signed   By: Alcide Clever M.D.   On: 06/08/2015 22:22   I have personally reviewed and evaluated these images and lab results as part of my medical decision-making.   EKG Interpretation   Date/Time:  Tuesday June 08 2015 17:05:13 EDT Ventricular Rate:  98 PR Interval:  138 QRS Duration: 70 QT Interval:  328 QTC Calculation: 418 R Axis:   57 Text Interpretation:  Normal sinus rhythm Nonspecific T wave abnormality  Abnormal ECG no significant change since 2004 Confirmed by Gail Vendetti MD,  Atziry Baranski [phone]) on 06/08/2015 6:46:59 PM      Angiocath insertion Performed by: Pricilla Loveless T  Consent: Verbal consent obtained. Risks and benefits: risks, benefits and alternatives were discussed Time out: Immediately prior to procedure a "time out" was called to verify the correct patient, procedure, equipment, support staff and site/side marked as required.  Preparation: Patient was prepped  and draped in the usual sterile fashion.  Vein Location: right basilic  Ultrasound Guided  Gauge: 20  Normal blood return and flush without difficulty Patient tolerance: Patient tolerated the procedure well with no immediate complications.    MDM   Final diagnoses:  Acute diverticulitis    CT scan shows diverticulitis but no abscess or perforation. Has low-grade fever here and elevated white blood cell count. I had a long discussion with family who wants to take patient home. She is currently in no distress. She repeatedly denies pain to me except on palpation. Family feels comfortable taking the patient home and wants to try oral antibiotics at home and return if symptoms worsen. I discussed that if at any point any symptoms worsen especially worse abdominal pain or if she develops vomiting or continued fever seems to come back immediately. Tolerated oral antibiotics without difficulty. Discharge with strict return precautions.    Pricilla Loveless, MD 06/09/15 [phone]

## 2015-06-08 NOTE — ED Notes (Signed)
Pt stable, ambulatory, states understanding of discharge instructions 

## 2015-06-08 NOTE — ED Notes (Signed)
Pt sent here by primary care MD and stating pain in lower abdomen with fever.  Pt has already had appendix out, and pt denies urinary symptoms.  Here to r/o gallbladder and family reports recent heart burn symptoms

## 2015-06-11 DIAGNOSIS — E119 Type 2 diabetes mellitus without complications: Secondary | ICD-10-CM | POA: Diagnosis not present

## 2015-06-11 DIAGNOSIS — R112 Nausea with vomiting, unspecified: Secondary | ICD-10-CM | POA: Diagnosis not present

## 2015-06-11 DIAGNOSIS — H0500 Unspecified acute inflammation of orbit: Secondary | ICD-10-CM | POA: Diagnosis not present

## 2015-06-11 DIAGNOSIS — R1030 Lower abdominal pain, unspecified: Secondary | ICD-10-CM | POA: Diagnosis not present

## 2015-06-11 DIAGNOSIS — K5732 Diverticulitis of large intestine without perforation or abscess without bleeding: Secondary | ICD-10-CM | POA: Diagnosis not present

## 2015-06-11 DIAGNOSIS — H25813 Combined forms of age-related cataract, bilateral: Secondary | ICD-10-CM | POA: Diagnosis not present

## 2015-06-23 DIAGNOSIS — T7840XA Allergy, unspecified, initial encounter: Secondary | ICD-10-CM | POA: Diagnosis not present

## 2015-07-20 DIAGNOSIS — E1165 Type 2 diabetes mellitus with hyperglycemia: Secondary | ICD-10-CM | POA: Diagnosis not present

## 2015-07-20 DIAGNOSIS — E7800 Pure hypercholesterolemia, unspecified: Secondary | ICD-10-CM | POA: Diagnosis not present

## 2015-07-26 DIAGNOSIS — E1165 Type 2 diabetes mellitus with hyperglycemia: Secondary | ICD-10-CM | POA: Diagnosis not present

## 2015-07-26 DIAGNOSIS — I10 Essential (primary) hypertension: Secondary | ICD-10-CM | POA: Diagnosis not present

## 2015-07-26 DIAGNOSIS — E7800 Pure hypercholesterolemia, unspecified: Secondary | ICD-10-CM | POA: Diagnosis not present

## 2015-07-26 DIAGNOSIS — E039 Hypothyroidism, unspecified: Secondary | ICD-10-CM | POA: Diagnosis not present

## 2015-09-09 DIAGNOSIS — I10 Essential (primary) hypertension: Secondary | ICD-10-CM | POA: Diagnosis not present

## 2015-09-09 DIAGNOSIS — R079 Chest pain, unspecified: Secondary | ICD-10-CM | POA: Diagnosis not present

## 2015-09-09 DIAGNOSIS — E7800 Pure hypercholesterolemia, unspecified: Secondary | ICD-10-CM | POA: Diagnosis not present

## 2015-09-09 DIAGNOSIS — R0602 Shortness of breath: Secondary | ICD-10-CM | POA: Diagnosis not present

## 2015-09-23 DIAGNOSIS — I10 Essential (primary) hypertension: Secondary | ICD-10-CM | POA: Diagnosis not present

## 2015-09-23 DIAGNOSIS — R0602 Shortness of breath: Secondary | ICD-10-CM | POA: Diagnosis not present

## 2015-09-23 DIAGNOSIS — R079 Chest pain, unspecified: Secondary | ICD-10-CM | POA: Diagnosis not present

## 2015-09-24 DIAGNOSIS — R0789 Other chest pain: Secondary | ICD-10-CM | POA: Diagnosis not present

## 2015-09-24 DIAGNOSIS — R0602 Shortness of breath: Secondary | ICD-10-CM | POA: Diagnosis not present

## 2015-09-30 DIAGNOSIS — E7800 Pure hypercholesterolemia, unspecified: Secondary | ICD-10-CM | POA: Diagnosis not present

## 2015-09-30 DIAGNOSIS — I10 Essential (primary) hypertension: Secondary | ICD-10-CM | POA: Diagnosis not present

## 2015-09-30 DIAGNOSIS — R0602 Shortness of breath: Secondary | ICD-10-CM | POA: Diagnosis not present

## 2015-09-30 DIAGNOSIS — R079 Chest pain, unspecified: Secondary | ICD-10-CM | POA: Diagnosis not present

## 2015-10-05 DIAGNOSIS — I10 Essential (primary) hypertension: Secondary | ICD-10-CM | POA: Diagnosis not present

## 2015-10-05 DIAGNOSIS — E1165 Type 2 diabetes mellitus with hyperglycemia: Secondary | ICD-10-CM | POA: Diagnosis not present

## 2015-10-05 DIAGNOSIS — E559 Vitamin D deficiency, unspecified: Secondary | ICD-10-CM | POA: Diagnosis not present

## 2015-10-05 DIAGNOSIS — Z0000 Encounter for general adult medical examination without abnormal findings: Secondary | ICD-10-CM | POA: Diagnosis not present

## 2015-10-05 DIAGNOSIS — Z23 Encounter for immunization: Secondary | ICD-10-CM | POA: Diagnosis not present

## 2015-10-05 DIAGNOSIS — E039 Hypothyroidism, unspecified: Secondary | ICD-10-CM | POA: Diagnosis not present

## 2015-10-05 DIAGNOSIS — Z780 Asymptomatic menopausal state: Secondary | ICD-10-CM | POA: Diagnosis not present

## 2015-10-19 DIAGNOSIS — E1165 Type 2 diabetes mellitus with hyperglycemia: Secondary | ICD-10-CM | POA: Diagnosis not present

## 2015-10-19 DIAGNOSIS — N182 Chronic kidney disease, stage 2 (mild): Secondary | ICD-10-CM | POA: Diagnosis not present

## 2015-10-19 DIAGNOSIS — I129 Hypertensive chronic kidney disease with stage 1 through stage 4 chronic kidney disease, or unspecified chronic kidney disease: Secondary | ICD-10-CM | POA: Diagnosis not present

## 2015-10-19 DIAGNOSIS — Z0000 Encounter for general adult medical examination without abnormal findings: Secondary | ICD-10-CM | POA: Diagnosis not present

## 2016-01-11 DIAGNOSIS — N182 Chronic kidney disease, stage 2 (mild): Secondary | ICD-10-CM | POA: Diagnosis not present

## 2016-01-11 DIAGNOSIS — I129 Hypertensive chronic kidney disease with stage 1 through stage 4 chronic kidney disease, or unspecified chronic kidney disease: Secondary | ICD-10-CM | POA: Diagnosis not present

## 2016-01-11 DIAGNOSIS — E785 Hyperlipidemia, unspecified: Secondary | ICD-10-CM | POA: Diagnosis not present

## 2016-01-11 DIAGNOSIS — E039 Hypothyroidism, unspecified: Secondary | ICD-10-CM | POA: Diagnosis not present

## 2016-01-11 DIAGNOSIS — E1165 Type 2 diabetes mellitus with hyperglycemia: Secondary | ICD-10-CM | POA: Diagnosis not present

## 2016-01-18 DIAGNOSIS — E1165 Type 2 diabetes mellitus with hyperglycemia: Secondary | ICD-10-CM | POA: Diagnosis not present

## 2016-04-17 DIAGNOSIS — E039 Hypothyroidism, unspecified: Secondary | ICD-10-CM | POA: Diagnosis not present

## 2016-04-17 DIAGNOSIS — E1165 Type 2 diabetes mellitus with hyperglycemia: Secondary | ICD-10-CM | POA: Diagnosis not present

## 2016-04-17 DIAGNOSIS — E785 Hyperlipidemia, unspecified: Secondary | ICD-10-CM | POA: Diagnosis not present

## 2016-04-27 ENCOUNTER — Other Ambulatory Visit: Payer: Self-pay | Admitting: Internal Medicine

## 2016-04-27 DIAGNOSIS — I129 Hypertensive chronic kidney disease with stage 1 through stage 4 chronic kidney disease, or unspecified chronic kidney disease: Secondary | ICD-10-CM | POA: Diagnosis not present

## 2016-04-27 DIAGNOSIS — E785 Hyperlipidemia, unspecified: Secondary | ICD-10-CM | POA: Diagnosis not present

## 2016-04-27 DIAGNOSIS — N182 Chronic kidney disease, stage 2 (mild): Secondary | ICD-10-CM | POA: Diagnosis not present

## 2016-04-27 DIAGNOSIS — E1122 Type 2 diabetes mellitus with diabetic chronic kidney disease: Secondary | ICD-10-CM | POA: Diagnosis not present

## 2016-04-28 ENCOUNTER — Other Ambulatory Visit: Payer: Self-pay | Admitting: Internal Medicine

## 2016-04-28 DIAGNOSIS — R7989 Other specified abnormal findings of blood chemistry: Secondary | ICD-10-CM

## 2016-04-28 DIAGNOSIS — R945 Abnormal results of liver function studies: Principal | ICD-10-CM

## 2016-05-04 ENCOUNTER — Other Ambulatory Visit: Payer: Self-pay

## 2016-05-04 NOTE — Patient Outreach (Signed)
[facility]  05/04/2016  [name] LeiterHilda C [name] 08/30/1940 [id number]   Medication adherence to Mrs. Berton BonHilda Reh  We call Mrs.[name] for her to fill her lisinopril 5 mg  she was due on /03/23/16 she mention she still has some medication and she will call the pharmacy when she needs it.    Lillia AbedAna Ollison-Moran CPhT Pharmacy Technician [facility] Care Management Direct Dial [phone]  Fax [phone] [email or web address]

## 2016-05-05 ENCOUNTER — Ambulatory Visit
Admission: RE | Admit: 2016-05-05 | Discharge: 2016-05-05 | Disposition: A | Discharge status: Home / Self Care | Payer: Medicare Other | Source: Ambulatory Visit | Attending: Internal Medicine | Admitting: Internal Medicine

## 2016-05-05 DIAGNOSIS — R945 Abnormal results of liver function studies: Principal | ICD-10-CM

## 2016-05-05 DIAGNOSIS — R7989 Other specified abnormal findings of blood chemistry: Secondary | ICD-10-CM

## 2016-05-10 ENCOUNTER — Other Ambulatory Visit: Payer: Self-pay | Admitting: Internal Medicine

## 2016-05-10 DIAGNOSIS — R9389 Abnormal findings on diagnostic imaging of other specified body structures: Secondary | ICD-10-CM

## 2016-05-11 DIAGNOSIS — E1122 Type 2 diabetes mellitus with diabetic chronic kidney disease: Secondary | ICD-10-CM | POA: Diagnosis not present

## 2016-05-11 DIAGNOSIS — I129 Hypertensive chronic kidney disease with stage 1 through stage 4 chronic kidney disease, or unspecified chronic kidney disease: Secondary | ICD-10-CM | POA: Diagnosis not present

## 2016-05-11 DIAGNOSIS — E1165 Type 2 diabetes mellitus with hyperglycemia: Secondary | ICD-10-CM | POA: Diagnosis not present

## 2016-05-11 DIAGNOSIS — N182 Chronic kidney disease, stage 2 (mild): Secondary | ICD-10-CM | POA: Diagnosis not present

## 2016-05-16 ENCOUNTER — Other Ambulatory Visit: Payer: Medicare Other

## 2016-05-18 ENCOUNTER — Ambulatory Visit
Admission: RE | Admit: 2016-05-18 | Discharge: 2016-05-18 | Disposition: A | Discharge status: Home / Self Care | Payer: Medicare Other | Source: Ambulatory Visit | Attending: Internal Medicine | Admitting: Internal Medicine

## 2016-05-18 DIAGNOSIS — R9389 Abnormal findings on diagnostic imaging of other specified body structures: Secondary | ICD-10-CM

## 2016-05-18 DIAGNOSIS — N200 Calculus of kidney: Secondary | ICD-10-CM | POA: Diagnosis not present

## 2016-05-18 MED ORDER — IOPAMIDOL (ISOVUE-300) INJECTION 61%
100.0000 mL | Freq: Once | INTRAVENOUS | Status: AC | PRN
  Administered 2016-05-18: 100 mL via INTRAVENOUS

## 2016-05-31 DIAGNOSIS — H5060 Mechanical strabismus, unspecified: Secondary | ICD-10-CM | POA: Diagnosis not present

## 2016-05-31 DIAGNOSIS — H524 Presbyopia: Secondary | ICD-10-CM | POA: Diagnosis not present

## 2016-05-31 DIAGNOSIS — E119 Type 2 diabetes mellitus without complications: Secondary | ICD-10-CM | POA: Diagnosis not present

## 2016-06-14 DIAGNOSIS — Z1231 Encounter for screening mammogram for malignant neoplasm of breast: Secondary | ICD-10-CM | POA: Diagnosis not present

## 2016-06-14 DIAGNOSIS — Z01419 Encounter for gynecological examination (general) (routine) without abnormal findings: Secondary | ICD-10-CM | POA: Diagnosis not present

## 2016-06-14 DIAGNOSIS — Z13820 Encounter for screening for osteoporosis: Secondary | ICD-10-CM | POA: Diagnosis not present

## 2016-06-14 DIAGNOSIS — N958 Other specified menopausal and perimenopausal disorders: Secondary | ICD-10-CM | POA: Diagnosis not present

## 2016-09-27 DIAGNOSIS — L814 Other melanin hyperpigmentation: Secondary | ICD-10-CM | POA: Diagnosis not present

## 2016-09-27 DIAGNOSIS — L820 Inflamed seborrheic keratosis: Secondary | ICD-10-CM | POA: Diagnosis not present

## 2016-09-27 DIAGNOSIS — L570 Actinic keratosis: Secondary | ICD-10-CM | POA: Diagnosis not present

## 2016-11-16 DIAGNOSIS — E1165 Type 2 diabetes mellitus with hyperglycemia: Secondary | ICD-10-CM | POA: Diagnosis not present

## 2016-11-16 DIAGNOSIS — E039 Hypothyroidism, unspecified: Secondary | ICD-10-CM | POA: Diagnosis not present

## 2016-11-16 DIAGNOSIS — N182 Chronic kidney disease, stage 2 (mild): Secondary | ICD-10-CM | POA: Diagnosis not present

## 2016-11-16 DIAGNOSIS — E785 Hyperlipidemia, unspecified: Secondary | ICD-10-CM | POA: Diagnosis not present

## 2016-11-16 DIAGNOSIS — I129 Hypertensive chronic kidney disease with stage 1 through stage 4 chronic kidney disease, or unspecified chronic kidney disease: Secondary | ICD-10-CM | POA: Diagnosis not present

## 2016-11-22 DIAGNOSIS — E538 Deficiency of other specified B group vitamins: Secondary | ICD-10-CM | POA: Diagnosis not present

## 2016-11-22 DIAGNOSIS — Z0000 Encounter for general adult medical examination without abnormal findings: Secondary | ICD-10-CM | POA: Diagnosis not present

## 2016-12-24 ENCOUNTER — Encounter (HOSPITAL_COMMUNITY): Payer: Self-pay | Admitting: Emergency Medicine

## 2016-12-24 ENCOUNTER — Emergency Department (HOSPITAL_COMMUNITY)
Admission: EM | Admit: 2016-12-24 | Discharge: 2016-12-24 | Disposition: A | Discharge status: Home / Self Care | Payer: Medicare Other | Attending: Emergency Medicine | Admitting: Emergency Medicine

## 2016-12-24 ENCOUNTER — Emergency Department (HOSPITAL_COMMUNITY): Payer: Medicare Other

## 2016-12-24 DIAGNOSIS — Z7982 Long term (current) use of aspirin: Secondary | ICD-10-CM | POA: Insufficient documentation

## 2016-12-24 DIAGNOSIS — Z794 Long term (current) use of insulin: Secondary | ICD-10-CM | POA: Diagnosis not present

## 2016-12-24 DIAGNOSIS — Z79899 Other long term (current) drug therapy: Secondary | ICD-10-CM | POA: Diagnosis not present

## 2016-12-24 DIAGNOSIS — K5792 Diverticulitis of intestine, part unspecified, without perforation or abscess without bleeding: Secondary | ICD-10-CM

## 2016-12-24 DIAGNOSIS — E119 Type 2 diabetes mellitus without complications: Secondary | ICD-10-CM | POA: Insufficient documentation

## 2016-12-24 DIAGNOSIS — R1032 Left lower quadrant pain: Secondary | ICD-10-CM | POA: Diagnosis present

## 2016-12-24 DIAGNOSIS — Z87891 Personal history of nicotine dependence: Secondary | ICD-10-CM | POA: Insufficient documentation

## 2016-12-24 DIAGNOSIS — K5732 Diverticulitis of large intestine without perforation or abscess without bleeding: Secondary | ICD-10-CM | POA: Insufficient documentation

## 2016-12-24 DIAGNOSIS — R109 Unspecified abdominal pain: Secondary | ICD-10-CM | POA: Diagnosis not present

## 2016-12-24 LAB — URINALYSIS, ROUTINE W REFLEX MICROSCOPIC
BACTERIA UA: NONE SEEN
BILIRUBIN URINE: NEGATIVE
Glucose, UA: NEGATIVE mg/dL
HGB URINE DIPSTICK: NEGATIVE
Ketones, ur: 5 mg/dL — AB
NITRITE: NEGATIVE
PH: 5 (ref 5.0–8.0)
Protein, ur: NEGATIVE mg/dL
SPECIFIC GRAVITY, URINE: 1.024 (ref 1.005–1.030)

## 2016-12-24 LAB — CBC WITH DIFFERENTIAL/PLATELET
BASOS ABS: 0 10*3/uL (ref 0.0–0.1)
Basophils Relative: 0 %
Eosinophils Absolute: 0 10*3/uL (ref 0.0–0.7)
Eosinophils Relative: 0 %
HEMATOCRIT: 33.9 % — AB (ref 36.0–46.0)
Hemoglobin: 11.3 g/dL — ABNORMAL LOW (ref 12.0–15.0)
LYMPHS ABS: 2 10*3/uL (ref 0.7–4.0)
Lymphocytes Relative: 14 %
MCH: 31.6 pg (ref 26.0–34.0)
MCHC: 33.3 g/dL (ref 30.0–36.0)
MCV: 94.7 fL (ref 78.0–100.0)
MONOS PCT: 23 %
Monocytes Absolute: 3.3 10*3/uL — ABNORMAL HIGH (ref 0.1–1.0)
NEUTROS PCT: 63 %
Neutro Abs: 9 10*3/uL — ABNORMAL HIGH (ref 1.7–7.7)
PLATELETS: 163 10*3/uL (ref 150–400)
RBC: 3.58 MIL/uL — AB (ref 3.87–5.11)
RDW: 13.4 % (ref 11.5–15.5)
WBC: 14.3 10*3/uL — AB (ref 4.0–10.5)

## 2016-12-24 LAB — COMPREHENSIVE METABOLIC PANEL
ALBUMIN: 4 g/dL (ref 3.5–5.0)
ALK PHOS: 32 U/L — AB (ref 38–126)
ALT: 27 U/L (ref 14–54)
AST: 21 U/L (ref 15–41)
Anion gap: 11 (ref 5–15)
BUN: 20 mg/dL (ref 6–20)
CHLORIDE: 102 mmol/L (ref 101–111)
CO2: 19 mmol/L — AB (ref 22–32)
Calcium: 8.8 mg/dL — ABNORMAL LOW (ref 8.9–10.3)
Creatinine, Ser: 1.08 mg/dL — ABNORMAL HIGH (ref 0.44–1.00)
GFR calc Af Amer: 56 mL/min — ABNORMAL LOW (ref >60)
GFR calc non Af Amer: 48 mL/min — ABNORMAL LOW (ref >60)
Glucose, Bld: 216 mg/dL — ABNORMAL HIGH (ref 65–99)
Potassium: 4.1 mmol/L (ref 3.5–5.1)
SODIUM: 132 mmol/L — AB (ref 135–145)
TOTAL PROTEIN: 7.6 g/dL (ref 6.5–8.1)
Total Bilirubin: 0.8 mg/dL (ref 0.3–1.2)

## 2016-12-24 LAB — LIPASE, BLOOD: Lipase: 32 U/L (ref 11–51)

## 2016-12-24 MED ORDER — CIPROFLOXACIN HCL 500 MG PO TABS
500.0000 mg | ORAL_TABLET | Freq: Once | ORAL | Status: AC
  Administered 2016-12-24: 500 mg via ORAL
  Filled 2016-12-24: qty 1

## 2016-12-24 MED ORDER — METRONIDAZOLE 500 MG PO TABS
500.0000 mg | ORAL_TABLET | Freq: Once | ORAL | Status: AC
  Administered 2016-12-24: 500 mg via ORAL
  Filled 2016-12-24: qty 1

## 2016-12-24 MED ORDER — SODIUM CHLORIDE 0.9 % IV BOLUS (SEPSIS)
500.0000 mL | Freq: Once | INTRAVENOUS | Status: AC
  Administered 2016-12-24: 500 mL via INTRAVENOUS

## 2016-12-24 MED ORDER — METRONIDAZOLE 500 MG PO TABS: 500.0000 mg | ORAL_TABLET | Freq: Three times a day (TID) | ORAL | 0 refills | Status: DC

## 2016-12-24 MED ORDER — IOPAMIDOL (ISOVUE-300) INJECTION 61%
INTRAVENOUS | Status: AC
  Administered 2016-12-24: 100 mL
  Filled 2016-12-24: qty 100

## 2016-12-24 MED ORDER — CIPROFLOXACIN HCL 500 MG PO TABS: 500.0000 mg | ORAL_TABLET | Freq: Two times a day (BID) | ORAL | 0 refills | Status: DC

## 2016-12-24 NOTE — ED Notes (Signed)
Patient transported to CT 

## 2016-12-24 NOTE — ED Provider Notes (Signed)
[facility] Provider Note   CSN: [id number] Arrival date & time: 12/24/16  [id number]     History   Chief Complaint Chief Complaint  Patient presents with  . Abdominal Pain    HPI [name] is a 76 y.o. female.  The history is provided by the patient. No language interpreter was used.  Abdominal Pain     [name] is a 76 y.o. female who presents to the Emergency Department complaining of abdominal pain.  She reports 1 week of left lower quadrant abdominal pain.  Pain is described as soreness in nature.  It is constant with no clear alleviating or worsening factors.  She has no associated fever, nausea, vomiting.  She did develop diarrhea this morning.  She states pain is similar to prior flareups of her diverticulitis.  Pain is moderate in nature. Past Medical History:  Diagnosis Date  . Alzheimer's dementia   . Diabetes mellitus without complication (HCC)   . Thyroid disease     There are no active problems to display for this patient.   Past Surgical History:  Procedure Laterality Date  . ABDOMINAL HYSTERECTOMY    . APPENDECTOMY  2003    OB History    No data available       Home Medications    Prior to Admission medications   Medication Sig Start Date End Date Taking? Authorizing Provider  aspirin EC 81 MG tablet Take 81 mg by mouth daily.   Yes [provider]  CINNAMON PO Take 2 tablets by mouth daily.   Yes [provider]  gabapentin (NEURONTIN) 600 MG tablet Take 600 mg by mouth 3 (three) times daily.   Yes [provider]  glimepiride (AMARYL) 4 MG tablet Take 4 mg by mouth 2 (two) times daily.   Yes [provider]  insulin glargine (LANTUS) 100 UNIT/ML injection Inject 32 Units into the skin daily.   Yes [provider]  levothyroxine (SYNTHROID, LEVOTHROID) 100 MCG tablet Take 100 mcg by mouth daily before breakfast.   Yes [provider]  lisinopril  (PRINIVIL,ZESTRIL) 5 MG tablet Take 5 mg by mouth daily.   Yes [provider]  Multiple Vitamins-Minerals (MULTIVITAMIN WITH MINERALS) tablet Take 1 tablet by mouth daily.   Yes [provider]  pramipexole (MIRAPEX) 0.125 MG tablet Take 0.125 mg by mouth at bedtime.   Yes [provider]  simvastatin (ZOCOR) 40 MG tablet Take 40 mg by mouth daily.   Yes [provider]  sitaGLIPtin-metformin (JANUMET) 50-1000 MG tablet Take 1 tablet by mouth 2 (two) times daily with a meal.   Yes [provider]  ciprofloxacin (CIPRO) 500 MG tablet Take 1 tablet (500 mg total) by mouth 2 (two) times daily. 12/24/16   Tilden Fossaees, Jaideep Pollack, MD  HYDROcodone-acetaminophen [facility](NORCO) 5-325 MG tablet Take 1 tablet by mouth every 6 (six) hours as needed for severe pain. Patient not taking: Reported on 12/24/2016 06/08/15   Pricilla LovelessGoldston, Scott, MD  metroNIDAZOLE (FLAGYL) 500 MG tablet Take 1 tablet (500 mg total) by mouth 3 (three) times daily. 12/24/16   Tilden Fossaees, Brantlee Penn, MD    Family History No family history on file.  Social History Social History   Tobacco Use  . Smoking status: Former Games developermoker  . Smokeless tobacco: Never Used  Substance Use Topics  . Alcohol use: No  . Drug use: No     Allergies   Penicillins and Sulfa antibiotics   Review of  Systems Review of Systems  Gastrointestinal: Positive for abdominal pain.  All other systems reviewed and are negative.    Physical Exam Updated Vital Signs BP (!) 112/59   Pulse 88   Temp 98.1 F (36.7 C) (Oral)   Resp 18   SpO2 95%   Physical Exam  Constitutional: She is oriented to person, place, and time. She appears well-developed and well-nourished.  HENT:  Head: Normocephalic and atraumatic.  Cardiovascular: Normal rate and regular rhythm.  No murmur heard. Pulmonary/Chest: Effort normal and breath sounds normal. No respiratory distress.  Abdominal: Soft.  Mild generalized abdominal tenderness with moderate  focal tenderness in the left lower quadrant.  No guarding or rebound.  Musculoskeletal: She exhibits no edema or tenderness.  Neurological: She is alert and oriented to person, place, and time.  Skin: Skin is warm and dry.  Psychiatric: She has a normal mood and affect. Her behavior is normal.  Nursing note and vitals reviewed.    ED Treatments / Results  Labs (all labs ordered are listed, but only abnormal results are displayed) Labs Reviewed  COMPREHENSIVE METABOLIC PANEL - Abnormal; Notable for the following components:      Result Value   Sodium 132 (*)    CO2 19 (*)    Glucose, Bld 216 (*)    Creatinine, Ser 1.08 (*)    Calcium 8.8 (*)    Alkaline Phosphatase 32 (*)    GFR calc non Af Amer 48 (*)    GFR calc Af Amer 56 (*)    All other components within normal limits  CBC WITH DIFFERENTIAL/PLATELET - Abnormal; Notable for the following components:   WBC 14.3 (*)    RBC 3.58 (*)    Hemoglobin 11.3 (*)    HCT 33.9 (*)    Neutro Abs 9.0 (*)    Monocytes Absolute 3.3 (*)    All other components within normal limits  URINALYSIS, ROUTINE W REFLEX MICROSCOPIC - Abnormal; Notable for the following components:   APPearance HAZY (*)    Ketones, ur 5 (*)    Leukocytes, UA SMALL (*)    Squamous Epithelial / LPF 0-5 (*)    All other components within normal limits  LIPASE, BLOOD    EKG  EKG Interpretation None       Radiology Ct Abdomen Pelvis W Contrast  Result Date: 12/24/2016 CLINICAL DATA:  76 year old with acute onset of left lower quadrant abdominal pain. Surgical history includes hysterectomy and appendectomy. EXAM: CT ABDOMEN AND PELVIS WITH CONTRAST TECHNIQUE: Multidetector CT imaging of the abdomen and pelvis was performed using the standard protocol following bolus administration of intravenous contrast. CONTRAST:  ISOVUE-300 IOPAMIDOL INJECTION 61% IV. Oral contrast was not administered. COMPARISON:  05/18/2016, 06/08/2015 and earlier. FINDINGS: Lower  chest: Stable scarring in the right middle lobe and lingula. Scattered areas of focal hyperlucency in the visualized lung bases. Heart mildly enlarged, unchanged. Aortic annular calcification. LAD coronary atherosclerosis. Hepatobiliary: Liver normal in size and appearance. Gallbladder normal in appearance without calcified gallstones. No biliary ductal dilation. Pancreas: Normal in appearance without evidence of mass, ductal dilation, or inflammation. Spleen: Normal in size and appearance. Adrenals/Urinary Tract: Normal appearing adrenal glands. Kidneys normal in size and appearance without focal parenchymal abnormality. No evidence of urinary tract calculi or obstruction. Normal-appearing urinary bladder. Stomach/Bowel: Small hiatal hernia. Stomach decompressed and otherwise normal in appearance. Normal-appearing small bowel. Diffuse colonic diverticulosis, most severe in the sigmoid colon, with evidence of edema/inflammation adjacent to the proximal sigmoid colon.  No evidence of extraluminal gas or abscess. Ascending colon completely decompressed. Mobile cecum positioned in the right upper quadrant. Appendix surgically absent. Vascular/Lymphatic: Severe aortoiliofemoral atherosclerosis without evidence of aneurysm. Normal-appearing portal venous and systemic venous systems. No pathologic lymphadenopathy. Reproductive: Surgically absent uterus.  No adnexal masses. Other: Periumbilical anterior abdominal wall hernia containing fat. Musculoskeletal: Multilevel degenerative disc disease and spondylosis throughout the visualized mid and lower thoracic spine and lumbar spine. Facet degenerative changes at L4-5 and L5-S1. Central disc protrusion at L5-S1. Multifactorial spinal stenosis at L4-5. No acute osseous abnormality. IMPRESSION: 1. Acute diverticulitis involving the proximal sigmoid colon. No evidence of perforation or abscess. 2. Small hiatal hernia. 3. Periumbilical anterior abdominal wall hernia containing  fat. 4. Musculoskeletal findings as above, the most significant finding that of multifactorial spinal stenosis at L4-5. 5.  Aortic Atherosclerosis (ICD10-170.0) Electronically Signed   By: Hulan Saashomas  Lawrence M.D.   On: 12/24/2016 10:09    Procedures Procedures (including critical care time)  Medications Ordered in ED Medications  iopamidol (ISOVUE-300) 61 % injection (100 mLs  Contrast Given 12/24/16 0941)  sodium chloride 0.9 % bolus 500 mL (0 mLs Intravenous Stopped 12/24/16 0948)  metroNIDAZOLE (FLAGYL) tablet 500 mg (500 mg Oral Given 12/24/16 1056)  ciprofloxacin (CIPRO) tablet 500 mg (500 mg Oral Given 12/24/16 1056)     Initial Impression / Assessment and Plan / ED Course  I have reviewed the triage vital signs and the nursing notes.  Pertinent labs & imaging results that were available during my care of the patient were reviewed by me and considered in my medical decision making (see chart for details).    Patient here for evaluation of 1 week of left lower quadrant abdominal pain with diarrhea since yesterday.  She does have tenderness on examination with no peritoneal findings.  Labs demonstrate leukocytosis and imaging demonstrates acute diverticulitis with no evidence of perforation or abscess.  She is able to tolerate orals without difficulty and is nontoxic appearing.  Discussed with patient treatment options and she prefers outpatient management.  Discussed with patient home care, outpatient follow-up and return precautions.  Final Clinical Impressions(s) / ED Diagnoses   Final diagnoses:  Acute diverticulitis    ED Discharge Orders        Ordered    metroNIDAZOLE (FLAGYL) 500 MG tablet  3 times daily     12/24/16 1049    ciprofloxacin (CIPRO) 500 MG tablet  2 times daily     12/24/16 1049       Tilden Fossaees, Maresa Morash, MD 12/24/16 1625

## 2016-12-24 NOTE — ED Triage Notes (Signed)
Pt arrives via EMS from home with complaints of LLQ abd pain. States that is the same feeling when she is having a diverticulosis flare up.

## 2017-01-11 DIAGNOSIS — I129 Hypertensive chronic kidney disease with stage 1 through stage 4 chronic kidney disease, or unspecified chronic kidney disease: Secondary | ICD-10-CM | POA: Diagnosis not present

## 2017-01-11 DIAGNOSIS — K760 Fatty (change of) liver, not elsewhere classified: Secondary | ICD-10-CM | POA: Diagnosis not present

## 2017-01-11 DIAGNOSIS — E785 Hyperlipidemia, unspecified: Secondary | ICD-10-CM | POA: Diagnosis not present

## 2017-01-11 DIAGNOSIS — Z0000 Encounter for general adult medical examination without abnormal findings: Secondary | ICD-10-CM | POA: Diagnosis not present

## 2017-01-11 DIAGNOSIS — E1165 Type 2 diabetes mellitus with hyperglycemia: Secondary | ICD-10-CM | POA: Diagnosis not present

## 2017-01-31 DIAGNOSIS — K5732 Diverticulitis of large intestine without perforation or abscess without bleeding: Secondary | ICD-10-CM | POA: Diagnosis not present

## 2017-04-04 DIAGNOSIS — E538 Deficiency of other specified B group vitamins: Secondary | ICD-10-CM | POA: Diagnosis not present

## 2017-04-05 DIAGNOSIS — E1165 Type 2 diabetes mellitus with hyperglycemia: Secondary | ICD-10-CM | POA: Diagnosis not present

## 2017-04-05 DIAGNOSIS — E039 Hypothyroidism, unspecified: Secondary | ICD-10-CM | POA: Diagnosis not present

## 2017-04-05 DIAGNOSIS — E785 Hyperlipidemia, unspecified: Secondary | ICD-10-CM | POA: Diagnosis not present

## 2017-04-05 DIAGNOSIS — I129 Hypertensive chronic kidney disease with stage 1 through stage 4 chronic kidney disease, or unspecified chronic kidney disease: Secondary | ICD-10-CM | POA: Diagnosis not present

## 2017-04-05 DIAGNOSIS — E538 Deficiency of other specified B group vitamins: Secondary | ICD-10-CM | POA: Diagnosis not present

## 2017-05-31 DIAGNOSIS — E1165 Type 2 diabetes mellitus with hyperglycemia: Secondary | ICD-10-CM | POA: Diagnosis not present

## 2017-07-09 DIAGNOSIS — Z794 Long term (current) use of insulin: Secondary | ICD-10-CM | POA: Diagnosis not present

## 2017-07-09 DIAGNOSIS — E118 Type 2 diabetes mellitus with unspecified complications: Secondary | ICD-10-CM | POA: Diagnosis not present

## 2017-07-26 DIAGNOSIS — Z124 Encounter for screening for malignant neoplasm of cervix: Secondary | ICD-10-CM | POA: Diagnosis not present

## 2017-07-26 DIAGNOSIS — Z1231 Encounter for screening mammogram for malignant neoplasm of breast: Secondary | ICD-10-CM | POA: Diagnosis not present

## 2017-08-09 DIAGNOSIS — Z794 Long term (current) use of insulin: Secondary | ICD-10-CM | POA: Diagnosis not present

## 2017-08-09 DIAGNOSIS — E118 Type 2 diabetes mellitus with unspecified complications: Secondary | ICD-10-CM | POA: Diagnosis not present

## 2017-08-13 ENCOUNTER — Other Ambulatory Visit: Payer: Self-pay

## 2017-08-13 NOTE — Patient Outreach (Signed)
[facility] Care Management  08/13/2017  [name] LeiterHilda C [name] 01/27/1940 [id number]   Medication Adherence call to Mrs. Berton BonHilda [name] patient is due on Lisinopril 5 mg spoke with patient she already pick up on August 10th. [name] is showing past due under [employer].  [name] AbedAna [name] CPhT Pharmacy Technician [facility] Care Management Direct Dial [phone]  Fax [phone] [name]

## 2017-08-16 DIAGNOSIS — E119 Type 2 diabetes mellitus without complications: Secondary | ICD-10-CM | POA: Diagnosis not present

## 2017-08-16 DIAGNOSIS — H2513 Age-related nuclear cataract, bilateral: Secondary | ICD-10-CM | POA: Diagnosis not present

## 2017-08-16 DIAGNOSIS — H5060 Mechanical strabismus, unspecified: Secondary | ICD-10-CM | POA: Diagnosis not present

## 2017-08-16 DIAGNOSIS — H52223 Regular astigmatism, bilateral: Secondary | ICD-10-CM | POA: Diagnosis not present

## 2017-09-09 DIAGNOSIS — E118 Type 2 diabetes mellitus with unspecified complications: Secondary | ICD-10-CM | POA: Diagnosis not present

## 2017-09-09 DIAGNOSIS — Z794 Long term (current) use of insulin: Secondary | ICD-10-CM | POA: Diagnosis not present

## 2017-09-10 ENCOUNTER — Other Ambulatory Visit: Payer: Self-pay

## 2017-09-10 NOTE — Patient Outreach (Signed)
[facility]) Care Management  09/10/2017  NALIYA KISLER 1940/06/27 [id number]   Medication Adherence call to Mrs. Desaray Seper spoke with patient she still have medication for about 10 days she said she will order it once she is finished with what she have patient is due on Simvastatin 40 mg and Lisinopril 5 mg. Mrs. Tomek is showing past due under [name] [employer].   Lillia Abed CPhT Pharmacy Technician [facility] Care Management Direct Dial [phone]  Fax [phone] [email or web address]

## 2017-09-27 DIAGNOSIS — I129 Hypertensive chronic kidney disease with stage 1 through stage 4 chronic kidney disease, or unspecified chronic kidney disease: Secondary | ICD-10-CM | POA: Diagnosis not present

## 2017-09-27 DIAGNOSIS — Z0000 Encounter for general adult medical examination without abnormal findings: Secondary | ICD-10-CM | POA: Diagnosis not present

## 2017-09-27 DIAGNOSIS — E039 Hypothyroidism, unspecified: Secondary | ICD-10-CM | POA: Diagnosis not present

## 2017-09-27 DIAGNOSIS — E1165 Type 2 diabetes mellitus with hyperglycemia: Secondary | ICD-10-CM | POA: Diagnosis not present

## 2017-09-27 DIAGNOSIS — E785 Hyperlipidemia, unspecified: Secondary | ICD-10-CM | POA: Diagnosis not present

## 2017-09-27 DIAGNOSIS — Z23 Encounter for immunization: Secondary | ICD-10-CM | POA: Diagnosis not present

## 2017-10-12 DIAGNOSIS — Z794 Long term (current) use of insulin: Secondary | ICD-10-CM | POA: Diagnosis not present

## 2017-10-12 DIAGNOSIS — E118 Type 2 diabetes mellitus with unspecified complications: Secondary | ICD-10-CM | POA: Diagnosis not present

## 2017-11-12 DIAGNOSIS — E118 Type 2 diabetes mellitus with unspecified complications: Secondary | ICD-10-CM | POA: Diagnosis not present

## 2017-11-12 DIAGNOSIS — Z794 Long term (current) use of insulin: Secondary | ICD-10-CM | POA: Diagnosis not present

## 2017-12-12 DIAGNOSIS — I209 Angina pectoris, unspecified: Secondary | ICD-10-CM | POA: Diagnosis not present

## 2017-12-12 DIAGNOSIS — E118 Type 2 diabetes mellitus with unspecified complications: Secondary | ICD-10-CM | POA: Diagnosis not present

## 2017-12-12 DIAGNOSIS — I10 Essential (primary) hypertension: Secondary | ICD-10-CM | POA: Diagnosis not present

## 2017-12-12 DIAGNOSIS — Z794 Long term (current) use of insulin: Secondary | ICD-10-CM | POA: Diagnosis not present

## 2017-12-12 DIAGNOSIS — E7800 Pure hypercholesterolemia, unspecified: Secondary | ICD-10-CM | POA: Diagnosis not present

## 2017-12-12 DIAGNOSIS — R0602 Shortness of breath: Secondary | ICD-10-CM | POA: Diagnosis not present

## 2018-01-01 DIAGNOSIS — I209 Angina pectoris, unspecified: Secondary | ICD-10-CM | POA: Diagnosis not present

## 2018-01-04 DIAGNOSIS — I200 Unstable angina: Secondary | ICD-10-CM | POA: Diagnosis not present

## 2018-01-09 DIAGNOSIS — E118 Type 2 diabetes mellitus with unspecified complications: Secondary | ICD-10-CM | POA: Diagnosis not present

## 2018-01-09 DIAGNOSIS — Z794 Long term (current) use of insulin: Secondary | ICD-10-CM | POA: Diagnosis not present

## 2018-01-10 DIAGNOSIS — R079 Chest pain, unspecified: Secondary | ICD-10-CM | POA: Diagnosis not present

## 2018-01-10 DIAGNOSIS — I10 Essential (primary) hypertension: Secondary | ICD-10-CM | POA: Diagnosis not present

## 2018-01-10 DIAGNOSIS — E7800 Pure hypercholesterolemia, unspecified: Secondary | ICD-10-CM | POA: Diagnosis not present

## 2018-01-10 DIAGNOSIS — R0602 Shortness of breath: Secondary | ICD-10-CM | POA: Diagnosis not present

## 2018-01-31 DIAGNOSIS — E1165 Type 2 diabetes mellitus with hyperglycemia: Secondary | ICD-10-CM | POA: Diagnosis not present

## 2018-01-31 DIAGNOSIS — E785 Hyperlipidemia, unspecified: Secondary | ICD-10-CM | POA: Diagnosis not present

## 2018-01-31 DIAGNOSIS — E039 Hypothyroidism, unspecified: Secondary | ICD-10-CM | POA: Diagnosis not present

## 2018-01-31 DIAGNOSIS — I129 Hypertensive chronic kidney disease with stage 1 through stage 4 chronic kidney disease, or unspecified chronic kidney disease: Secondary | ICD-10-CM | POA: Diagnosis not present

## 2018-02-07 ENCOUNTER — Encounter: Payer: Self-pay | Admitting: Pulmonary Disease

## 2018-02-07 ENCOUNTER — Ambulatory Visit (INDEPENDENT_AMBULATORY_CARE_PROVIDER_SITE_OTHER): Payer: Medicare Other | Admitting: Pulmonary Disease

## 2018-02-07 VITALS — BP 132/82 | HR 77 | Ht <= 58 in | Wt 187.0 lb

## 2018-02-07 DIAGNOSIS — Z87891 Personal history of nicotine dependence: Secondary | ICD-10-CM | POA: Diagnosis not present

## 2018-02-07 DIAGNOSIS — R0609 Other forms of dyspnea: Secondary | ICD-10-CM

## 2018-02-07 MED ORDER — UMECLIDINIUM-VILANTEROL 62.5-25 MCG/INH IN AEPB: 1.0000 | INHALATION_SPRAY | Freq: Every day | RESPIRATORY_TRACT | 0 refills | Status: AC

## 2018-02-07 MED ORDER — ALBUTEROL SULFATE HFA 108 (90 BASE) MCG/ACT IN AERS: 2.0000 | INHALATION_SPRAY | Freq: Four times a day (QID) | RESPIRATORY_TRACT | 6 refills | Status: AC | PRN

## 2018-02-07 NOTE — Progress Notes (Signed)
Synopsis: Referred in Feb 2020 for DOE by Toniann FailKelley, Ashton Haynes, *  Subjective:   PATIENT ID: [name] LeiterHilda C Lacomb GENDER: female DOB: 12/08/1940, MRN: [id number]  Chief Complaint  Patient presents with  . Consult    Consult for SOB. States it developed 2 years ago and she had her heart checked and it has slowyl gotten worse. She does have chest tightness and wheezing.    PMH Graves' disease, diabetes, Alzheimer's dementia.  Presents today to the office with complaints of dyspnea on exertion.  Recently had eval by Dr. Verl DickerGanji's office, cardiology. She had cardiac evaluation. Was told that he stress test was normal. Never had any lung disease. Father had lung cancer. Former smoker, quit in 1981, smoked for 20 years of smoking, 1-1.5, up to 30 pack year history, around second hand smoker from her father and mother, who smoked 3 packs per day. She has symptoms predominately with walking. On occasion at rest she breaths heavy. She had an episode while travel in [place] [employer].  She used to be much more physically active however over the past several years she has slowed down.  She used to walk regularly and she states that she felt a lot better when she was doing this.  She does understand that she needs to lose weight.    Past Medical History:  Diagnosis Date  . Alzheimer's dementia (HCC)   . Diabetes mellitus without complication (HCC)   . Graves disease   . Thyroid disease      Family History  Problem Relation Age of Onset  . COPD Mother   . COPD Father   . Lung cancer Father      Past Surgical History:  Procedure Laterality Date  . ABDOMINAL HYSTERECTOMY    . APPENDECTOMY  2003    Social History   Socioeconomic History  . Marital status: Married    Spouse name: Not on file  . Number of children: Not on file  . Years of education: Not on file  . Highest education level: Not on file  Occupational History  . Not on file  Social Needs  . Financial resource  strain: Not on file  . Food insecurity:    Worry: Not on file    Inability: Not on file  . Transportation needs:    Medical: Not on file    Non-medical: Not on file  Tobacco Use  . Smoking status: Former Games developermoker  . Smokeless tobacco: Never Used  Substance and Sexual Activity  . Alcohol use: No  . Drug use: No  . Sexual activity: Not on file  Lifestyle  . Physical activity:    Days per week: Not on file    Minutes per session: Not on file  . Stress: Not on file  Relationships  . Social connections:    Talks on phone: Not on file    Gets together: Not on file    Attends religious service: Not on file    Active member of club or organization: Not on file    Attends meetings of clubs or organizations: Not on file    Relationship status: Not on file  . Intimate partner violence:    Fear of current or ex partner: Not on file    Emotionally abused: Not on file    Physically abused: Not on file    Forced sexual activity: Not on file  Other Topics Concern  . Not on file  Social History Narrative  .  Not on file     Allergies  Allergen Reactions  . Penicillins Itching  . Sulfa Antibiotics      Outpatient Medications Prior to Visit  Medication Sig Dispense Refill  . amLODipine (NORVASC) 5 MG tablet     . aspirin EC 81 MG tablet Take 81 mg by mouth daily.    Marland Kitchen. gabapentin (NEURONTIN) 600 MG tablet Take 600 mg by mouth 3 (three) times daily.    . insulin glargine (LANTUS) 100 UNIT/ML injection Inject 30 Units into the skin daily.     . insulin lispro (HUMALOG) 100 UNIT/ML injection Inject into the skin 3 (three) times daily before meals. Sliding scale before meals    . levothyroxine (SYNTHROID, LEVOTHROID) 100 MCG tablet Take 100 mcg by mouth daily before breakfast.    . lisinopril (PRINIVIL,ZESTRIL) 5 MG tablet Take 5 mg by mouth daily.    . Multiple Vitamins-Minerals (MULTIVITAMIN WITH MINERALS) tablet Take 1 tablet by mouth daily.    . pramipexole (MIRAPEX) 0.125 MG tablet  Take 0.125 mg by mouth at bedtime.    . simvastatin (ZOCOR) 40 MG tablet Take 40 mg by mouth daily.    . sitaGLIPtin-metformin (JANUMET) 50-1000 MG tablet Take 1 tablet by mouth 2 (two) times daily with a meal.    . amLODipine (NORVASC) 10 MG tablet Take 10 mg by mouth daily.    Marland Kitchen. CINNAMON PO Take 2 tablets by mouth daily.    . ciprofloxacin (CIPRO) 500 MG tablet Take 1 tablet (500 mg total) by mouth 2 (two) times daily. (Patient not taking: Reported on 02/07/2018) 20 tablet 0  . glimepiride (AMARYL) 4 MG tablet Take 4 mg by mouth 2 (two) times daily.    Marland Kitchen. HYDROcodone-acetaminophen (NORCO) 5-325 MG tablet Take 1 tablet by mouth every 6 (six) hours as needed for severe pain. (Patient not taking: Reported on 02/07/2018) 10 tablet 0  . metroNIDAZOLE (FLAGYL) 500 MG tablet Take 1 tablet (500 mg total) by mouth 3 (three) times daily. (Patient not taking: Reported on 02/07/2018) 30 tablet 0   No facility-administered medications prior to visit.     Review of Systems  Constitutional: Negative for chills, fever, malaise/fatigue and weight loss.  HENT: Negative for hearing loss, sore throat and tinnitus.   Eyes: Negative for blurred vision and double vision.  Respiratory: Positive for shortness of breath. Negative for cough, hemoptysis, sputum production, wheezing and stridor.   Cardiovascular: Negative for chest pain, palpitations, orthopnea, leg swelling and PND.  Gastrointestinal: Negative for abdominal pain, constipation, diarrhea, heartburn, nausea and vomiting.  Genitourinary: Negative for dysuria, hematuria and urgency.  Musculoskeletal: Negative for joint pain and myalgias.  Skin: Negative for itching and rash.  Neurological: Negative for dizziness, tingling, weakness and headaches.  Endo/Heme/Allergies: Negative for environmental allergies. Does not bruise/bleed easily.  Psychiatric/Behavioral: Negative for depression. The patient is not nervous/anxious and does not have insomnia.   All other  systems reviewed and are negative.    Objective:  Physical Exam Vitals signs reviewed.  Constitutional:      General: She is not in acute distress.    Appearance: She is well-developed. She is obese.  HENT:     Head: Normocephalic and atraumatic.     Ears:     Comments: Bilateral proptosis Eyes:     General: No scleral icterus.    Conjunctiva/sclera: Conjunctivae normal.     Pupils: Pupils are equal, round, and reactive to light.  Neck:     Musculoskeletal: Neck supple.  Vascular: No JVD.     Trachea: No tracheal deviation.  Cardiovascular:     Rate and Rhythm: Normal rate and regular rhythm.     Heart sounds: Normal heart sounds. No murmur.  Pulmonary:     Effort: Pulmonary effort is normal. No tachypnea, accessory muscle usage or respiratory distress.     Breath sounds: Normal breath sounds. No stridor. No wheezing, rhonchi or rales.  Abdominal:     General: Bowel sounds are normal. There is no distension.     Palpations: Abdomen is soft.     Tenderness: There is no abdominal tenderness.  Musculoskeletal:        General: No tenderness.  Lymphadenopathy:     Cervical: No cervical adenopathy.  Skin:    General: Skin is warm and dry.     Capillary Refill: Capillary refill takes less than 2 seconds.     Findings: No rash.  Neurological:     Mental Status: She is alert and oriented to person, place, and time.  Psychiatric:        Behavior: Behavior normal.      Vitals:   02/07/18 1030  BP: 132/82  Pulse: 77  SpO2: 94%  Weight: 187 lb (84.8 kg)  Height: 4\' 10"  (1.473 m)   94% on RA BMI Readings from Last 3 Encounters:  02/07/18 39.08 kg/m   Wt Readings from Last 3 Encounters:  02/07/18 187 lb (84.8 kg)     CBC    Component Value Date/Time   WBC 14.3 (H) 12/24/2016 0728   RBC 3.58 (L) 12/24/2016 0728   HGB 11.3 (L) 12/24/2016 0728   HCT 33.9 (L) 12/24/2016 0728   PLT 163 12/24/2016 0728   MCV 94.7 12/24/2016 0728   MCH 31.6 12/24/2016 0728    MCHC 33.3 12/24/2016 0728   RDW 13.4 12/24/2016 0728   LYMPHSABS 2.0 12/24/2016 0728   MONOABS 3.3 (H) 12/24/2016 0728   EOSABS 0.0 12/24/2016 0728   BASOSABS 0.0 12/24/2016 0728    Chest Imaging: No recent chest imaging  Pulmonary Functions Testing Results: No flowsheet data found.  FeNO: None   Pathology: None   Echocardiogram: None   Heart Catheterization: None     Assessment & Plan:   DOE (dyspnea on exertion) - Plan: Pulmonary function test  Former smoker  Discussion:  This is a 78 year old female with a 30-pack-year history of smoking however quit many many years ago.  She is experiencing some dyspnea on exertion.  Her cardiac work-up has been relatively unrevealing thus far.  She is obese with a BMI of 39.  I do believe this is a portion of her dyspnea.  However her smoking history would warrant further investigation to consider diseases such as chronic obstructive pulmonary disease.  I think we will begin with the following: We will start her on an inhaler to see if this makes any difference in her symptomatology. Patient was given samples of Anoro Ellipta today in the office. She can continue her albuterol inhaler for shortness of breath and wheezing. We will also obtain pulmonary function tests upon her return. She can see Elisha Headland, NP in 2 to 3 weeks to see how her symptoms are on the new inhaler as well as review her pulmonary function test.  Greater than 50% of this patient's 30-minute office visit was spent face-to-face discussing above recommendations and treatment plan   Current Outpatient Medications:  .  amLODipine (NORVASC) 5 MG tablet, , Disp: , Rfl:  .  aspirin EC 81 MG tablet, Take 81 mg by mouth daily., Disp: , Rfl:  .  gabapentin (NEURONTIN) 600 MG tablet, Take 600 mg by mouth 3 (three) times daily., Disp: , Rfl:  .  insulin glargine (LANTUS) 100 UNIT/ML injection, Inject 30 Units into the skin daily. , Disp: , Rfl:  .  insulin lispro (HUMALOG)  100 UNIT/ML injection, Inject into the skin 3 (three) times daily before meals. Sliding scale before meals, Disp: , Rfl:  .  levothyroxine (SYNTHROID, LEVOTHROID) 100 MCG tablet, Take 100 mcg by mouth daily before breakfast., Disp: , Rfl:  .  lisinopril (PRINIVIL,ZESTRIL) 5 MG tablet, Take 5 mg by mouth daily., Disp: , Rfl:  .  Multiple Vitamins-Minerals (MULTIVITAMIN WITH MINERALS) tablet, Take 1 tablet by mouth daily., Disp: , Rfl:  .  pramipexole (MIRAPEX) 0.125 MG tablet, Take 0.125 mg by mouth at bedtime., Disp: , Rfl:  .  simvastatin (ZOCOR) 40 MG tablet, Take 40 mg by mouth daily., Disp: , Rfl:  .  sitaGLIPtin-metformin (JANUMET) 50-1000 MG tablet, Take 1 tablet by mouth 2 (two) times daily with a meal., Disp: , Rfl:  .  albuterol (PROVENTIL HFA;VENTOLIN HFA) 108 (90 Base) MCG/ACT inhaler, Inhale 2 puffs into the lungs every 6 (six) hours as needed for wheezing or shortness of breath., Disp: 1 Inhaler, Rfl: 6 .  umeclidinium-vilanterol (ANORO ELLIPTA) 62.5-25 MCG/INH AEPB, Inhale 1 puff into the lungs daily., Disp: 2 each, Rfl: 0   Josephine Igo, DO [facility] 02/07/2018 5:54 PM

## 2018-02-07 NOTE — Patient Instructions (Addendum)
Thank you for visiting Dr. Tonia Brooms at [facility]. Today we recommend the following: Orders Placed This Encounter  Procedures  . Pulmonary function test   Meds ordered this encounter  Medications  . albuterol (PROVENTIL HFA;VENTOLIN HFA) 108 (90 Base) MCG/ACT inhaler    Sig: Inhale 2 puffs into the lungs every 6 (six) hours as needed for wheezing or shortness of breath.    Dispense:  1 Inhaler    Refill:  6  . umeclidinium-vilanterol (ANORO ELLIPTA) 62.5-25 MCG/INH AEPB    Sig: Inhale 1 puff into the lungs daily.    Dispense:  2 each    Refill:  0   We are starting you on Anoro. 1 puff daily.   Return in about 2 weeks (around 02/21/2018). Please F/U with NP to review PFT.

## 2018-02-09 DIAGNOSIS — E118 Type 2 diabetes mellitus with unspecified complications: Secondary | ICD-10-CM | POA: Diagnosis not present

## 2018-02-09 DIAGNOSIS — Z794 Long term (current) use of insulin: Secondary | ICD-10-CM | POA: Diagnosis not present

## 2018-02-22 ENCOUNTER — Ambulatory Visit: Payer: Medicare Other | Admitting: Pulmonary Disease

## 2018-02-22 NOTE — Progress Notes (Deleted)
@Patient  ID: [name], female    DOB: 1940-07-04, 78 y.o.   MRN: [id number]  No chief complaint on file.   Referring provider: No ref. provider found  HPI:  78 year old female former smoker initially referred to our office on 02/07/2018 for evaluation of dyspnea on exertion  PMH: Obese Smoker/ Smoking History: Former Smoker. Quit 1981. 30 pack year smoking history.  Maintenance:  Anoro Ellipta  Pt of: Dr. Tonia Brooms  02/22/2018  - Visit   HPI  Tests:    FENO:  No results found for: NITRICOXIDE  PFT: No flowsheet data found.  Imaging: No results found.    Specialty Problems      Pulmonary Problems   DOE (dyspnea on exertion)      Allergies  Allergen Reactions  . Penicillins Itching  . Sulfa Antibiotics     Immunization History  Administered Date(s) Administered  . Influenza Whole 10/07/2017    Past Medical History:  Diagnosis Date  . Alzheimer's dementia (HCC)   . Diabetes mellitus without complication (HCC)   . Graves disease   . Thyroid disease     Tobacco History: Social History   Tobacco Use  Smoking Status Former Smoker  Smokeless Tobacco Never Used   Counseling given: Not Answered   Outpatient Encounter Medications as of 02/22/2018  Medication Sig  . albuterol (PROVENTIL HFA;VENTOLIN HFA) 108 (90 Base) MCG/ACT inhaler Inhale 2 puffs into the lungs every 6 (six) hours as needed for wheezing or shortness of breath.  Marland Kitchen amLODipine (NORVASC) 5 MG tablet   . aspirin EC 81 MG tablet Take 81 mg by mouth daily.  Marland Kitchen gabapentin (NEURONTIN) 600 MG tablet Take 600 mg by mouth 3 (three) times daily.  . insulin glargine (LANTUS) 100 UNIT/ML injection Inject 30 Units into the skin daily.   . insulin lispro (HUMALOG) 100 UNIT/ML injection Inject into the skin 3 (three) times daily before meals. Sliding scale before meals  . levothyroxine (SYNTHROID, LEVOTHROID) 100 MCG tablet Take 100 mcg by mouth daily before breakfast.  . lisinopril (PRINIVIL,ZESTRIL)  5 MG tablet Take 5 mg by mouth daily.  . Multiple Vitamins-Minerals (MULTIVITAMIN WITH MINERALS) tablet Take 1 tablet by mouth daily.  . pramipexole (MIRAPEX) 0.125 MG tablet Take 0.125 mg by mouth at bedtime.  . simvastatin (ZOCOR) 40 MG tablet Take 40 mg by mouth daily.  . sitaGLIPtin-metformin (JANUMET) 50-1000 MG tablet Take 1 tablet by mouth 2 (two) times daily with a meal.  . umeclidinium-vilanterol (ANORO ELLIPTA) 62.5-25 MCG/INH AEPB Inhale 1 puff into the lungs daily.   No facility-administered encounter medications on file as of 02/22/2018.      Review of Systems  Review of Systems   Physical Exam  There were no vitals taken for this visit.  Wt Readings from Last 5 Encounters:  02/07/18 187 lb (84.8 kg)     Physical Exam    Lab Results:  CBC    Component Value Date/Time   WBC 14.3 (H) 12/24/2016 0728   RBC 3.58 (L) 12/24/2016 0728   HGB 11.3 (L) 12/24/2016 0728   HCT 33.9 (L) 12/24/2016 0728   PLT 163 12/24/2016 0728   MCV 94.7 12/24/2016 0728   MCH 31.6 12/24/2016 0728   MCHC 33.3 12/24/2016 0728   RDW 13.4 12/24/2016 0728   LYMPHSABS 2.0 12/24/2016 0728   MONOABS 3.3 (H) 12/24/2016 0728   EOSABS 0.0 12/24/2016 0728   BASOSABS 0.0 12/24/2016 0728    BMET    Component Value Date/Time  NA 132 (L) 12/24/2016 0728   K 4.1 12/24/2016 0728   CL 102 12/24/2016 0728   CO2 19 (L) 12/24/2016 0728   GLUCOSE 216 (H) 12/24/2016 0728   BUN 20 12/24/2016 0728   CREATININE 1.08 (H) 12/24/2016 0728   CALCIUM 8.8 (L) 12/24/2016 0728   GFRNONAA 48 (L) 12/24/2016 0728   GFRAA 56 (L) 12/24/2016 0728    BNP No results found for: BNP  ProBNP No results found for: PROBNP    Assessment & Plan:     No problem-specific Assessment & Plan notes found for this encounter.     Coral Ceo, NP 02/22/2018   This appointment was *** with over 50% of the time in direct face-to-face patient care, assessment, plan of care, and follow-up.

## 2018-03-06 NOTE — Progress Notes (Deleted)
    Subjective:   [name], female    DOB: 04/09/1940, 78 y.o.   MRN: [id number]  [name], Brian, MD (Inactive):  No chief complaint on file.   HPI: [name]  is a 78 y.o. female  with history of hypertension, hyperlipidemia, diabetes, and alzheimers disease.  Patient was recently seen on 12/12/2017 for exertional chest pain and shortness of breath that was noted while vacationing in [place]. She underwent Lexiscan nuclear stress test and echocardiogram and now presents for results.  She continues to have dyspnea on exertion and occasionally chest tightness with over exertion; however, admits to being mostly sedentary and is difficult to assess. Resolves with resting. Patient has alzheimers; however, is still funcitonal and able to stay at home during the day with frequent check ups by her husband that still works with running his own business. Daughter is present at bedside. She has noticed even while sitting she seems to have some episodes of shortness of breath. No leg edema. No PND or orthopnea.  Patient is a former heavy smoker that quit approximatley 30 years ago. She is followed by Endocrinology (Dr. Balan) for management of her diabetes and hypothyroidism. She admits that she does not follow a healthy diet.   Past Medical History:  Diagnosis Date  . Alzheimer's dementia (HCC)   . Diabetes mellitus without complication (HCC)   . Graves disease   . Thyroid disease     Past Surgical History:  Procedure Laterality Date  . ABDOMINAL HYSTERECTOMY    . APPENDECTOMY  2003    Family History  Problem Relation Age of Onset  . COPD Mother   . COPD Father   . Lung cancer Father     Social History   Socioeconomic History  . Marital status: Married    Spouse name: Not on file  . Number of children: Not on file  . Years of education: Not on file  . Highest education level: Not on file  Occupational History  . Not on file  Social Needs  . Financial  resource strain: Not on file  . Food insecurity:    Worry: Not on file    Inability: Not on file  . Transportation needs:    Medical: Not on file    Non-medical: Not on file  Tobacco Use  . Smoking status: Former Smoker  . Smokeless tobacco: Never Used  Substance and Sexual Activity  . Alcohol use: No  . Drug use: No  . Sexual activity: Not on file  Lifestyle  . Physical activity:    Days per week: Not on file    Minutes per session: Not on file  . Stress: Not on file  Relationships  . Social connections:    Talks on phone: Not on file    Gets together: Not on file    Attends religious service: Not on file    Active member of club or organization: Not on file    Attends meetings of clubs or organizations: Not on file    Relationship status: Not on file  . Intimate partner violence:    Fear of current or ex partner: Not on file    Emotionally abused: Not on file    Physically abused: Not on file    Forced sexual activity: Not on file  Other Topics Concern  . Not on file  Social History Narrative  . Not on file    No outpatient medications have been marked as taking for   the 03/07/18 encounter (Appointment) with Kelley, Ashton Haynes, NP.     Review of Systems  Constitution: Negative for decreased appetite, malaise/fatigue, weight gain and weight loss.  Eyes: Negative for visual disturbance.  Cardiovascular: Positive for chest pain and dyspnea on exertion (decreased exercise tolerance). Negative for claudication, leg swelling, orthopnea, palpitations and syncope.  Respiratory: Negative for cough, hemoptysis and wheezing.   Endocrine: Negative for cold intolerance and heat intolerance.  Hematologic/Lymphatic: Does not bruise/bleed easily.  Skin: Negative for nail changes.  Musculoskeletal: Negative for muscle weakness and myalgias.  Gastrointestinal: Negative for abdominal pain, change in bowel habit, nausea and vomiting.  Neurological: Negative for difficulty with  concentration, dizziness, focal weakness and headaches.  Psychiatric/Behavioral: Negative for altered mental status and suicidal ideas.  All other systems reviewed and are negative.      Objective:     There were no vitals taken for this visit.  Cardiac studies:  Echocardiogram 01/01/2018: 1. Left ventricle cavity is normal in size. Mild concentric hypertrophy of the left ventricle. Normal global wall motion. Visual EF is 50-55%. Doppler evidence of grade I (impaired) diastolic dysfunction, elevated LAP. 2. Left atrial cavity is mildly dilated. 3. Trace aortic regurgitation. Mild aortic valve leaflet calcification. 4. Mild to moderate mitral regurgitation. Mild calcification of the mitral valve annulus. 5. Mild tricuspid regurgitation. No evidence of pulmonary Hypertension. 6. c.f. echo. of 09/25/2015, LVEF is normal, diastolic dysfunction id grade 1 (it was grade 2 before), no other significant change.   EKG 12/12/2017: Normal sinus rhythm at 66 bpm, normal axis, no evidence of ischemia. Low voltage complexes.  Lexiscan myoview stress test 01/04/2018: 1. Resting electrocardiogram demonstrated normal sinus rhythm and normal resting conduction. Stress EKG is non diagnostic for ischemia as it is a pharmacologic stress using Lexiscan. The patient did not develop symptoms. 2. Myocardial perfusion imaging is normal. Overall left ventricular systolic function was normal without regional wall motion abnormalities. The left ventricular ejection fraction was 67%. This is a low risk study. 3. When compared to 09/24/2015, no significant change.  Recent Labs:  02/14/2018: LIPID PANEL: ***  04/09/2017: EGFR 51, creatinine 1.19, potassium 5.4, glucose 253, BMP otherwise normal. TSH 1.5. RBC 3.7, hemoglobin 11.8, hematocrit 35.7, normal platelet count, CBC otherwise normal.   07/20/2015: Glucose 139, creatinine 1.12, potassium 5.5, CMP otherwise normal, HbA1c 7.9%, total cholesterol 141, triglycerides  191, HDL 34, LDL 69 03/08/2015: Total cholesterol 230, triglycerides 255, HDL 36, LDL 143, TSH 1.16   Physical Exam  Constitutional: She appears well-developed. No distress.  HENT:  Head: Atraumatic.  Eyes: Conjunctivae are normal.  Neck: Neck supple. No JVD present. No thyromegaly present.  Cardiovascular: Normal rate, regular rhythm and normal heart sounds. Exam reveals no gallop.  No murmur heard. Pulses:      Carotid pulses are 2+ on the right side and 2+ on the left side.      Dorsalis pedis pulses are 2+ on the right side and 2+ on the left side.       Posterior tibial pulses are 2+ on the right side and 2+ on the left side.  Femoral and popliteal pulse difficult to feel due to patient's body habitus.   Pulmonary/Chest: Effort normal and breath sounds normal.  Abdominal: Soft. Bowel sounds are normal.  Musculoskeletal: Normal range of motion.        General: No edema.  Neurological: She is alert.  Skin: Skin is warm and dry.  Psychiatric: She has a normal mood and affect.         Assessment & Recommendations:  There are no diagnoses linked to this encounter.  I have discussed recently obtained test results with the patient and her daughter. Had low risk stress test without evidence of ischemia. She actually had improvement in diastolic dysfunction compared to 2017 to now grade 1. She continues to have dyspnea on exertion and exertional chest tightness; however, symptoms are difficult to assess as she is mostly sedentary. Suspect her symptoms are related to obesity with hypoventilation and inactivity. She does have previous history of heavy tobacco use, I will make referral for her to be evaluated by pulmonary as cardiac workup has been unyielding. If pulmonary evaluation is negative, can consider further cardiac workup.   I feel that she would benefit from pulmonary rehab; however, will wait until pulmonary evaluation. I have encouraged her to start daily exercise with walking  20 minutes daily.   Blood pressure is elevated today and was elevated at her last office visit. We'll change from simvastatin due to potential interaction with amlodipine. We'll start Crestor 20 mg daily. We'll check lipids and CMP in 6-8 weeks for follow-up. Discussed diet modifications including weight loss and will also help with controlling her diabetes. I will see her back in 8 weeks for follow-up.   Jeri Lager, MSN, APRN, FNP-C [facility], [place] Office: [phone] Fax: [phone]

## 2018-03-07 ENCOUNTER — Ambulatory Visit: Payer: Self-pay | Admitting: Cardiology

## 2018-03-07 ENCOUNTER — Telehealth: Payer: Self-pay

## 2018-03-07 NOTE — Telephone Encounter (Signed)
That's fine, they can reschedule at a later date as needed. Can you find out how she is doing since Dr. Tonia Brooms [facility]) saw her and started her on inhaler.

## 2018-03-08 NOTE — Telephone Encounter (Signed)
Ok thank you for checking 

## 2018-03-10 DIAGNOSIS — E118 Type 2 diabetes mellitus with unspecified complications: Secondary | ICD-10-CM | POA: Diagnosis not present

## 2018-03-10 DIAGNOSIS — Z794 Long term (current) use of insulin: Secondary | ICD-10-CM | POA: Diagnosis not present

## 2018-03-28 DIAGNOSIS — E119 Type 2 diabetes mellitus without complications: Secondary | ICD-10-CM | POA: Diagnosis not present

## 2018-03-28 DIAGNOSIS — Z794 Long term (current) use of insulin: Secondary | ICD-10-CM | POA: Diagnosis not present

## 2018-04-01 ENCOUNTER — Other Ambulatory Visit: Payer: Self-pay | Admitting: Cardiology

## 2018-04-03 ENCOUNTER — Telehealth: Payer: Self-pay

## 2018-04-10 ENCOUNTER — Ambulatory Visit: Payer: Self-pay | Admitting: Cardiology

## 2018-04-10 NOTE — Progress Notes (Deleted)
Subjective:   [name], female    DOB: 06-Sep-1940, 78 y.o.   MRN: [id number]  Thayer Headings, MD (Inactive):  No chief complaint on file.   HPI: [name]  is a 78 y.o. female  with hypertension, hyperlipidemia, diabetes, and alzheimers disease.  Patient was recently seen on 12/12/2017 for exertional chest pain and shortness of breath that was noted while vacationing in [place]. She underwent Lexiscan nuclear stress test and echocardiogram and now presents for results.  She continues to have dyspnea on exertion and occasionally chest tightness with over exertion; however, admits to being mostly sedentary and is difficult to assess. Resolves with resting. Patient has alzheimers; however, is still funcitonal and able to stay at home during the day with frequent check ups by her husband that still works with running his own business. Daughter is present at bedside. She has noticed even while sitting she seems to have some episodes of shortness of breath. No leg edema. No PND or orthopnea.  Patient is a former heavy smoker that quit approximatley 30 years ago. She is followed by Endocrinology (Dr. Talmage Nap) for management of her diabetes and hypothyroidism. She admits that she does not follow a healthy diet.   Past Medical History:  Diagnosis Date  . Alzheimer's dementia (HCC)   . Diabetes mellitus without complication (HCC)   . Graves disease   . Thyroid disease     Past Surgical History:  Procedure Laterality Date  . ABDOMINAL HYSTERECTOMY    . APPENDECTOMY  2003    Family History  Problem Relation Age of Onset  . COPD Mother   . COPD Father   . Lung cancer Father     Social History   Socioeconomic History  . Marital status: Married    Spouse name: Not on file  . Number of children: Not on file  . Years of education: Not on file  . Highest education level: Not on file  Occupational History  . Not on file  Social Needs  . Financial resource  strain: Not on file  . Food insecurity:    Worry: Not on file    Inability: Not on file  . Transportation needs:    Medical: Not on file    Non-medical: Not on file  Tobacco Use  . Smoking status: Former Games developer  . Smokeless tobacco: Never Used  Substance and Sexual Activity  . Alcohol use: No  . Drug use: No  . Sexual activity: Not on file  Lifestyle  . Physical activity:    Days per week: Not on file    Minutes per session: Not on file  . Stress: Not on file  Relationships  . Social connections:    Talks on phone: Not on file    Gets together: Not on file    Attends religious service: Not on file    Active member of club or organization: Not on file    Attends meetings of clubs or organizations: Not on file    Relationship status: Not on file  . Intimate partner violence:    Fear of current or ex partner: Not on file    Emotionally abused: Not on file    Physically abused: Not on file    Forced sexual activity: Not on file  Other Topics Concern  . Not on file  Social History Narrative  . Not on file    No outpatient medications have been marked as taking for the 04/10/18  encounter (Appointment) with Toniann FailKelley, Ashton Haynes, NP.     ROS     Objective:     There were no vitals taken for this visit.  Cardiac studies:  Lexiscan myoview stress test 01/04/2018: 1. Resting electrocardiogram demonstrated normal sinus rhythm and normal resting conduction. Stress EKG is non diagnostic for ischemia as it is a pharmacologic stress using Lexiscan. The patient did not develop symptoms. 2. Myocardial perfusion imaging is normal. Overall left ventricular systolic function was normal without regional wall motion abnormalities. The left ventricular ejection fraction was 67%.  This is a low risk study. 3.  When compared to 09/24/2015, no significant change.  Echo- 01/01/2018 1. Left ventricle cavity is normal in size. Mild concentric hypertrophy of the left ventricle. Normal  global wall motion. Visual EF is 50-55%. Doppler evidence of grade I (impaired) diastolic dysfunction, elevated LAP. 2. Left atrial cavity is mildly dilated. 3. Trace aortic regurgitation. Mild aortic valve leaflet calcification. 4. Mild to moderate mitral regurgitation. Mild calcification of the mitral valve annulus. 5. Mild tricuspid regurgitation. No evidence of pulmonary hypertension. 6. c.f. echo. of 09/25/2015, LVEF is normal, diastolic dysfunction id grade 1 (it was grade 2 before), no other significant change.  Recent Labs:***  Physical Exam         Assessment & Recommendations:  There are no diagnoses linked to this encounter.   Plan:   Altamese CarolinaAshton Kelley, MSN, APRN, FNP-C [facility] Office: [phone] Fax: [phone]

## 2018-04-11 NOTE — Telephone Encounter (Signed)
appt scheduled for 04/10/2018

## 2018-04-18 DIAGNOSIS — E118 Type 2 diabetes mellitus with unspecified complications: Secondary | ICD-10-CM | POA: Diagnosis not present

## 2018-04-18 DIAGNOSIS — Z794 Long term (current) use of insulin: Secondary | ICD-10-CM | POA: Diagnosis not present

## 2018-04-25 ENCOUNTER — Other Ambulatory Visit: Payer: Self-pay

## 2018-04-25 NOTE — Patient Outreach (Signed)
[facility] Customer service manager [facility]) Care Management  04/25/2018  [name] [birth year] [id number]   Medication Adherence call to [name] [facility] [employer] spoke with patients daughter she is due on Rosuvastatin 20 mg  she explain patient has dementia and sometimes she forgets to take her medication patient has medication at this time. [name] is showing past due under [name] [employer].   Lillia Abed CPhT Pharmacy Technician [facility] Care Management Direct Dial [phone]  Fax [phone] [email or web address]

## 2018-04-29 ENCOUNTER — Ambulatory Visit: Payer: Self-pay | Admitting: Cardiology

## 2018-05-18 DIAGNOSIS — Z794 Long term (current) use of insulin: Secondary | ICD-10-CM | POA: Diagnosis not present

## 2018-05-18 DIAGNOSIS — E118 Type 2 diabetes mellitus with unspecified complications: Secondary | ICD-10-CM | POA: Diagnosis not present

## 2018-06-11 DIAGNOSIS — E1165 Type 2 diabetes mellitus with hyperglycemia: Secondary | ICD-10-CM | POA: Diagnosis not present

## 2018-06-11 DIAGNOSIS — E1122 Type 2 diabetes mellitus with diabetic chronic kidney disease: Secondary | ICD-10-CM | POA: Diagnosis not present

## 2018-06-11 DIAGNOSIS — N182 Chronic kidney disease, stage 2 (mild): Secondary | ICD-10-CM | POA: Diagnosis not present

## 2018-06-11 DIAGNOSIS — E039 Hypothyroidism, unspecified: Secondary | ICD-10-CM | POA: Diagnosis not present

## 2018-06-11 DIAGNOSIS — E785 Hyperlipidemia, unspecified: Secondary | ICD-10-CM | POA: Diagnosis not present

## 2018-06-11 DIAGNOSIS — E559 Vitamin D deficiency, unspecified: Secondary | ICD-10-CM | POA: Diagnosis not present

## 2018-06-11 DIAGNOSIS — I129 Hypertensive chronic kidney disease with stage 1 through stage 4 chronic kidney disease, or unspecified chronic kidney disease: Secondary | ICD-10-CM | POA: Diagnosis not present

## 2018-06-18 DIAGNOSIS — E118 Type 2 diabetes mellitus with unspecified complications: Secondary | ICD-10-CM | POA: Diagnosis not present

## 2018-06-18 DIAGNOSIS — Z794 Long term (current) use of insulin: Secondary | ICD-10-CM | POA: Diagnosis not present

## 2018-07-11 DIAGNOSIS — Z794 Long term (current) use of insulin: Secondary | ICD-10-CM | POA: Diagnosis not present

## 2018-07-11 DIAGNOSIS — E118 Type 2 diabetes mellitus with unspecified complications: Secondary | ICD-10-CM | POA: Diagnosis not present

## 2018-08-11 DIAGNOSIS — E118 Type 2 diabetes mellitus with unspecified complications: Secondary | ICD-10-CM | POA: Diagnosis not present

## 2018-08-11 DIAGNOSIS — Z794 Long term (current) use of insulin: Secondary | ICD-10-CM | POA: Diagnosis not present

## 2018-09-11 DIAGNOSIS — Z794 Long term (current) use of insulin: Secondary | ICD-10-CM | POA: Diagnosis not present

## 2018-09-11 DIAGNOSIS — E118 Type 2 diabetes mellitus with unspecified complications: Secondary | ICD-10-CM | POA: Diagnosis not present

## 2018-09-26 ENCOUNTER — Telehealth: Payer: Self-pay

## 2018-09-26 ENCOUNTER — Other Ambulatory Visit: Payer: Self-pay | Admitting: Cardiology

## 2018-10-03 DIAGNOSIS — E039 Hypothyroidism, unspecified: Secondary | ICD-10-CM | POA: Diagnosis not present

## 2018-10-03 DIAGNOSIS — G309 Alzheimer's disease, unspecified: Secondary | ICD-10-CM | POA: Diagnosis not present

## 2018-10-03 DIAGNOSIS — E1122 Type 2 diabetes mellitus with diabetic chronic kidney disease: Secondary | ICD-10-CM | POA: Diagnosis not present

## 2018-10-03 DIAGNOSIS — Z23 Encounter for immunization: Secondary | ICD-10-CM | POA: Diagnosis not present

## 2018-10-03 DIAGNOSIS — I129 Hypertensive chronic kidney disease with stage 1 through stage 4 chronic kidney disease, or unspecified chronic kidney disease: Secondary | ICD-10-CM | POA: Diagnosis not present

## 2018-10-03 DIAGNOSIS — Z0000 Encounter for general adult medical examination without abnormal findings: Secondary | ICD-10-CM | POA: Diagnosis not present

## 2018-10-09 ENCOUNTER — Other Ambulatory Visit: Payer: Self-pay | Admitting: Cardiology

## 2018-10-14 DIAGNOSIS — E1122 Type 2 diabetes mellitus with diabetic chronic kidney disease: Secondary | ICD-10-CM | POA: Diagnosis not present

## 2018-10-14 DIAGNOSIS — E785 Hyperlipidemia, unspecified: Secondary | ICD-10-CM | POA: Diagnosis not present

## 2018-10-14 DIAGNOSIS — E1165 Type 2 diabetes mellitus with hyperglycemia: Secondary | ICD-10-CM | POA: Diagnosis not present

## 2018-10-14 DIAGNOSIS — N182 Chronic kidney disease, stage 2 (mild): Secondary | ICD-10-CM | POA: Diagnosis not present

## 2018-10-14 DIAGNOSIS — I129 Hypertensive chronic kidney disease with stage 1 through stage 4 chronic kidney disease, or unspecified chronic kidney disease: Secondary | ICD-10-CM | POA: Diagnosis not present

## 2018-10-17 DIAGNOSIS — E118 Type 2 diabetes mellitus with unspecified complications: Secondary | ICD-10-CM | POA: Diagnosis not present

## 2018-10-17 DIAGNOSIS — Z794 Long term (current) use of insulin: Secondary | ICD-10-CM | POA: Diagnosis not present

## 2018-11-17 DIAGNOSIS — Z794 Long term (current) use of insulin: Secondary | ICD-10-CM | POA: Diagnosis not present

## 2018-11-17 DIAGNOSIS — E118 Type 2 diabetes mellitus with unspecified complications: Secondary | ICD-10-CM | POA: Diagnosis not present

## 2018-12-17 DIAGNOSIS — E118 Type 2 diabetes mellitus with unspecified complications: Secondary | ICD-10-CM | POA: Diagnosis not present

## 2018-12-17 DIAGNOSIS — Z794 Long term (current) use of insulin: Secondary | ICD-10-CM | POA: Diagnosis not present

## 2018-12-20 DIAGNOSIS — L821 Other seborrheic keratosis: Secondary | ICD-10-CM | POA: Diagnosis not present

## 2018-12-24 IMAGING — US US ABDOMEN COMPLETE
1 series · 13 of 25 positions shown · non-contrast
Comparison: CT abdomen and pelvis June 08, 2015

CLINICAL DATA: Elevated liver enzymes

EXAM:
ABDOMEN ULTRASOUND COMPLETE

[Series 1: us abdomen complete · 0.24mm/px · 13 of 92 slices shown]
[im 1/92]
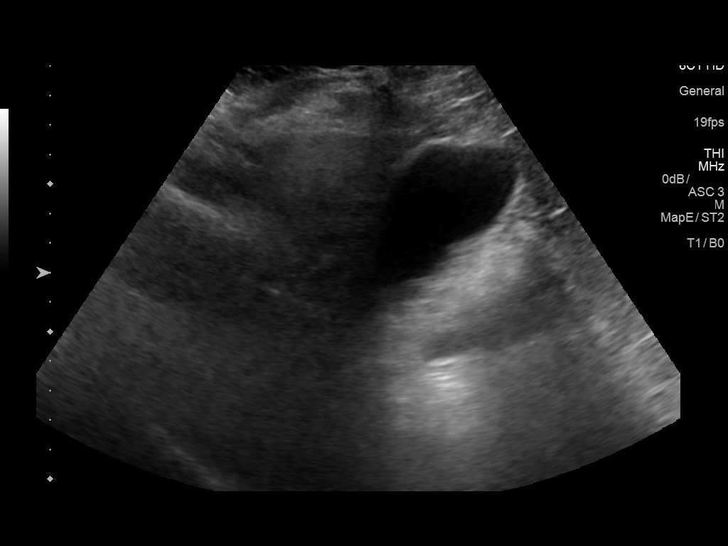
[im 8/92]
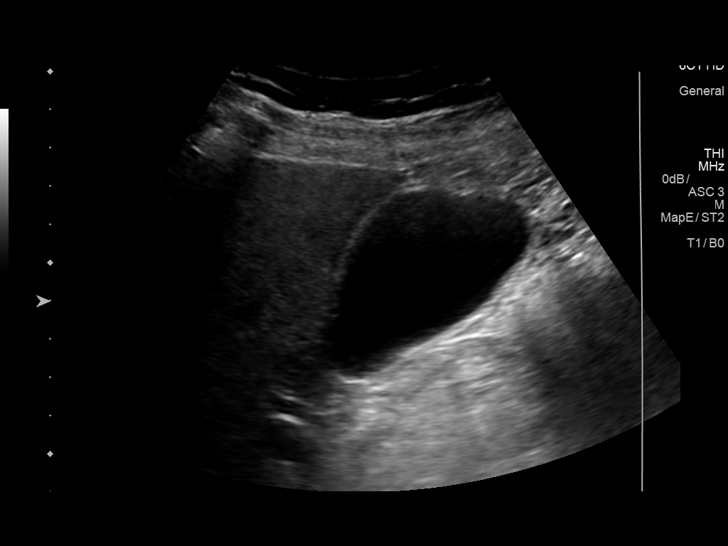
[im 16/92]
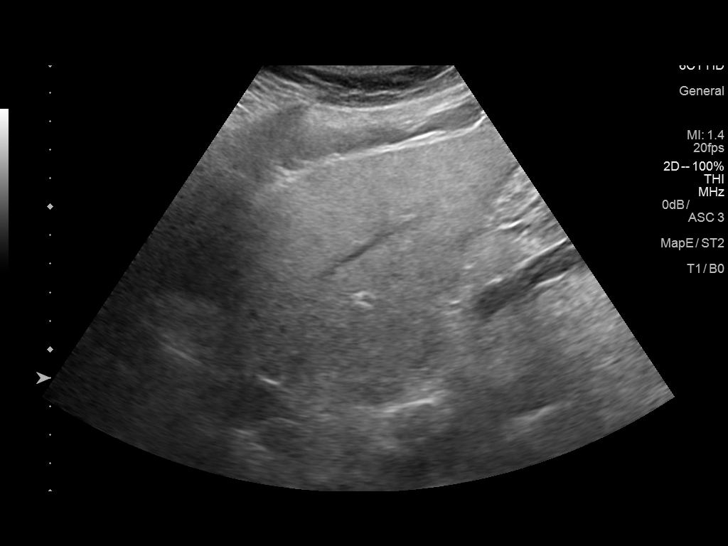
[im 23/92]
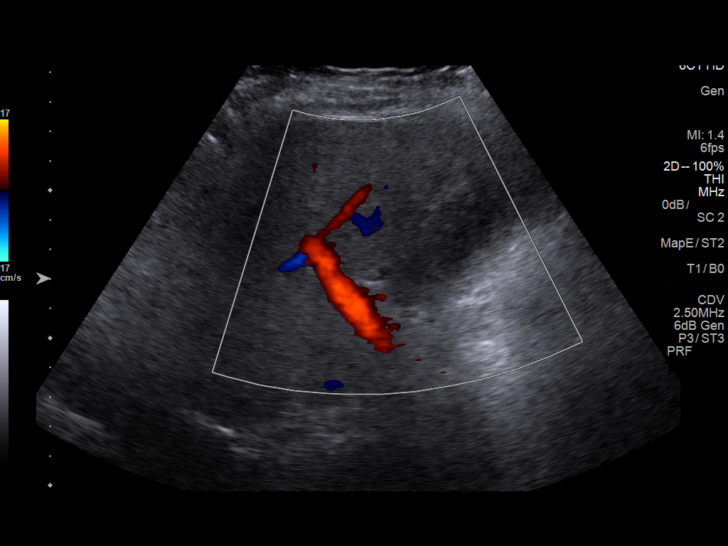
[im 31/92]
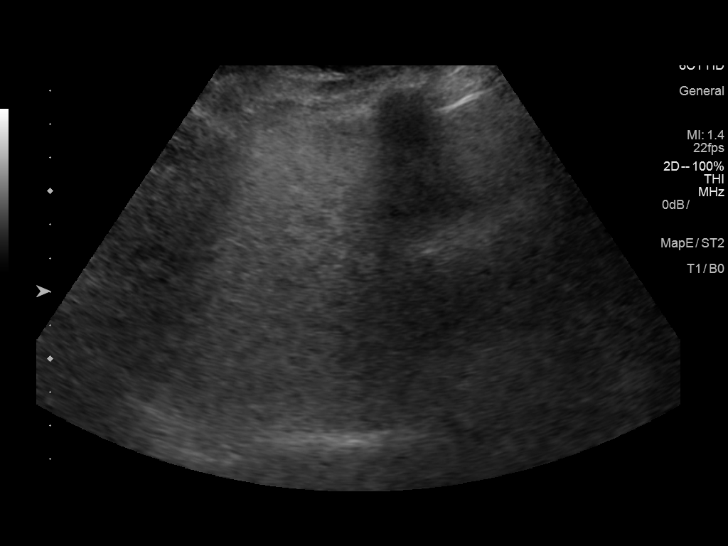
[im 38/92]
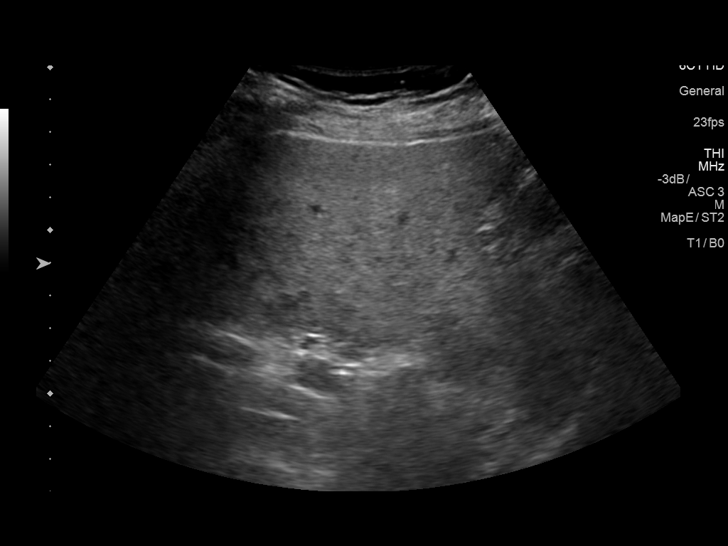
[im 46/92]
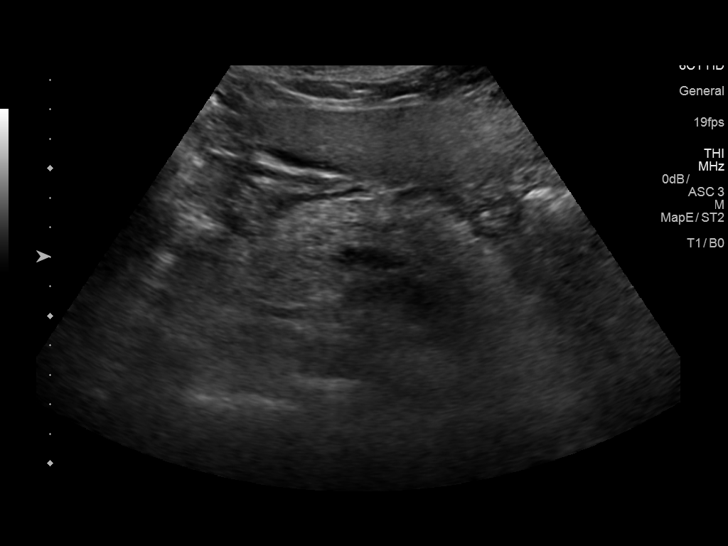
[im 54/92]
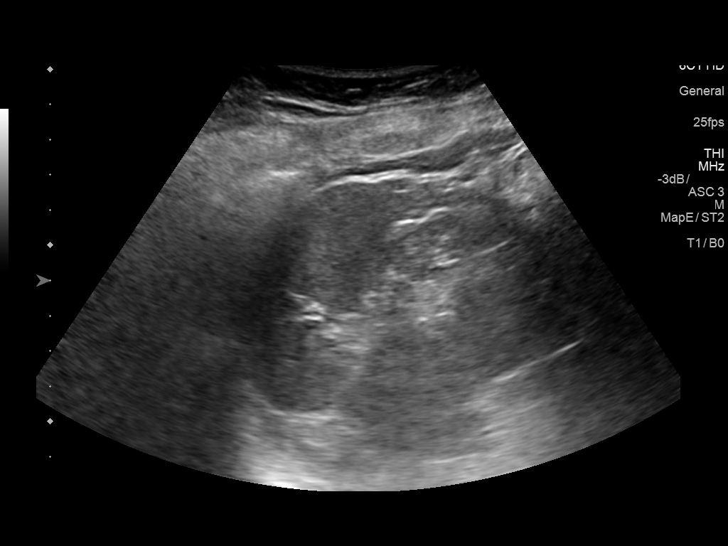
[im 61/92]
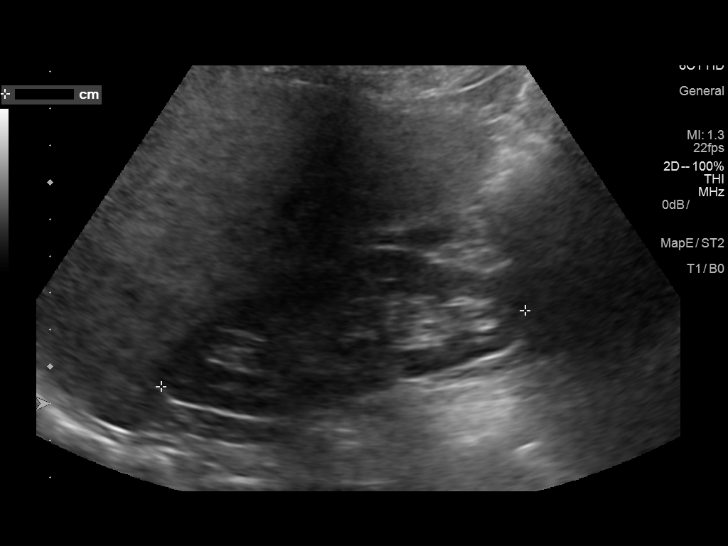
[im 69/92]
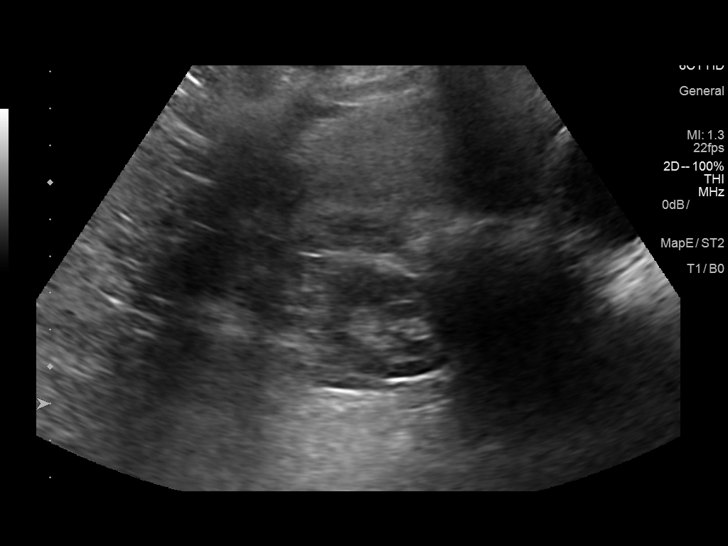
[im 76/92]
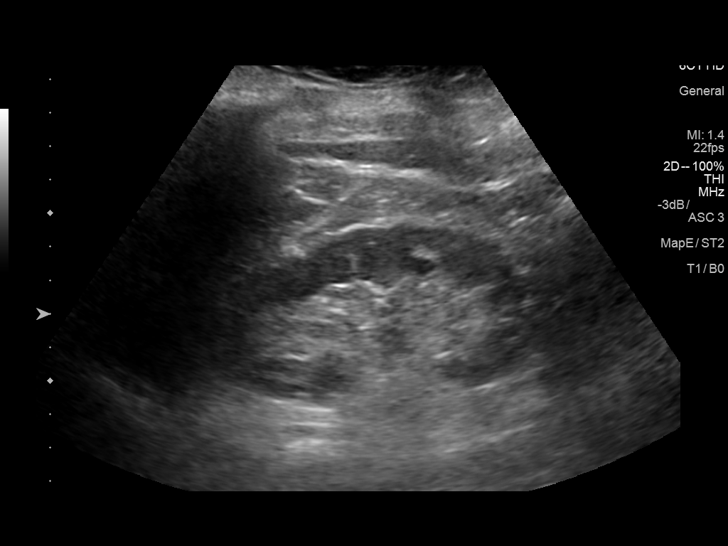
[im 84/92]
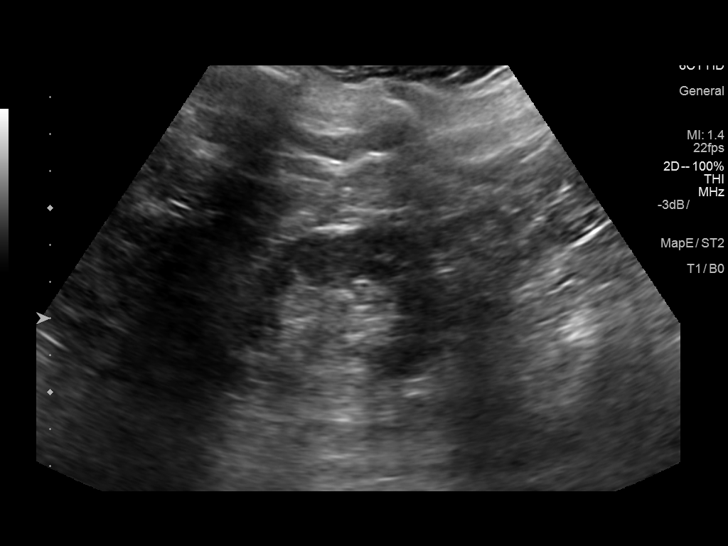
[im 92/92]
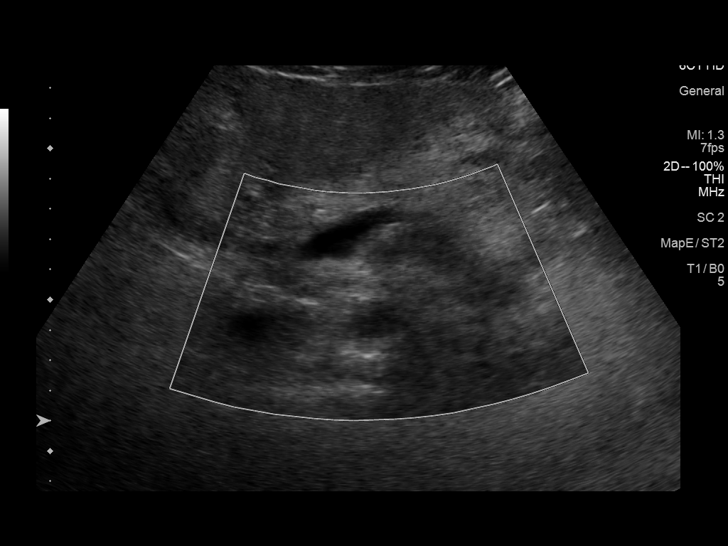

[13 of 25 positions shown; findings below may reference images not displayed]

FINDINGS: Gallbladder: No gallstones or wall thickening visualized. There is
no pericholecystic fluid. No sonographic Murphy sign noted by
sonographer.

Common bile duct: Diameter: 4 mm. There is no intrahepatic, common
hepatic, or common bile duct dilatation.

Liver: Liver measures at least 21 cm in length. There is diffuse
increased liver echogenicity. Scattered areas of decreased
attenuation within the liver potentially may represent fatty
sparing. These areas potentially may represent focal lesions within
the liver, however. Flow in the portal vein is in the anatomic
direction.

IVC: No abnormality visualized in visualized regions. The
infrahepatic inferior vena cava is largely obscured by gas.

Pancreas: Visualized portion unremarkable. Portions of pancreatic
tail obscured by gas.

Spleen: Size and appearance within normal limits.

Right Kidney: Length: 10.2 cm. Echogenicity within normal limits. No
mass or hydronephrosis visualized.

Left Kidney: Length: 10.6 cm. Echogenicity within normal limits. No
mass or hydronephrosis visualized.

Abdominal aorta: No aneurysm visualized.

Other findings: No demonstrable ascites.
IMPRESSION: Enlarged liver with increased echogenicity, likely due to hepatic
steatosis. Areas of decreased attenuation are scattered in the
liver, likely areas of fatty sparing. This appearance is somewhat
atypical for fatty sparing; given this circumstance and the
possibility that these areas of decreased attenuation represent
focal lesions as opposed to fatty sparing, correlation with pre and
serial post-contrast liver CT or MR advised to further evaluate.

Much of the inferior vena cava is obscured by gas. Portions of
pancreas obscured by gas. Visualized portions of the structures
appear normal.

Study otherwise unremarkable.

## 2019-01-07 DIAGNOSIS — E785 Hyperlipidemia, unspecified: Secondary | ICD-10-CM | POA: Diagnosis not present

## 2019-01-07 DIAGNOSIS — I129 Hypertensive chronic kidney disease with stage 1 through stage 4 chronic kidney disease, or unspecified chronic kidney disease: Secondary | ICD-10-CM | POA: Diagnosis not present

## 2019-01-07 DIAGNOSIS — E1165 Type 2 diabetes mellitus with hyperglycemia: Secondary | ICD-10-CM | POA: Diagnosis not present

## 2019-01-07 DIAGNOSIS — N182 Chronic kidney disease, stage 2 (mild): Secondary | ICD-10-CM | POA: Diagnosis not present

## 2019-01-07 DIAGNOSIS — E039 Hypothyroidism, unspecified: Secondary | ICD-10-CM | POA: Diagnosis not present

## 2019-01-17 DIAGNOSIS — E118 Type 2 diabetes mellitus with unspecified complications: Secondary | ICD-10-CM | POA: Diagnosis not present

## 2019-01-17 DIAGNOSIS — Z794 Long term (current) use of insulin: Secondary | ICD-10-CM | POA: Diagnosis not present

## 2019-01-21 ENCOUNTER — Other Ambulatory Visit: Payer: Self-pay | Admitting: Cardiology

## 2019-02-17 DIAGNOSIS — E118 Type 2 diabetes mellitus with unspecified complications: Secondary | ICD-10-CM | POA: Diagnosis not present

## 2019-02-17 DIAGNOSIS — Z794 Long term (current) use of insulin: Secondary | ICD-10-CM | POA: Diagnosis not present

## 2019-02-26 IMAGING — CT CT ABDOMEN WO/W CM
4 of 11 series · 16 of 36 positions shown, 19 images · IV contrast (READICAT/WATER & [ID] ISOVUE 300)
Comparison: 06/08/2015

CLINICAL DATA: Abnormal liver enzymes.

EXAM:
CT ABDOMEN WITHOUT AND WITH CONTRAST
TECHNIQUE: Multidetector CT imaging of the abdomen was performed following the
standard protocol before and following the bolus administration of
intravenous contrast.
CONTRAST:  100mL [id number] IOPAMIDOL ([id number]) INJECTION 61%

[Series 6: arterial (id) · axial · arterial · 0.86mm/px · z∈[-124,-47]mm · 2 of 93 slices shown]
[im 31/93  soft-tissue]
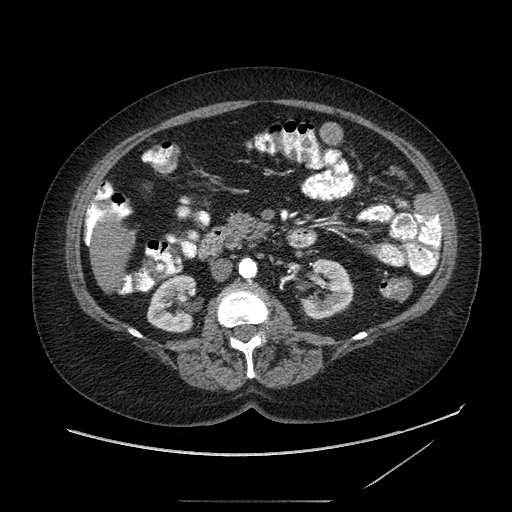
[im 62/93  soft-tissue]
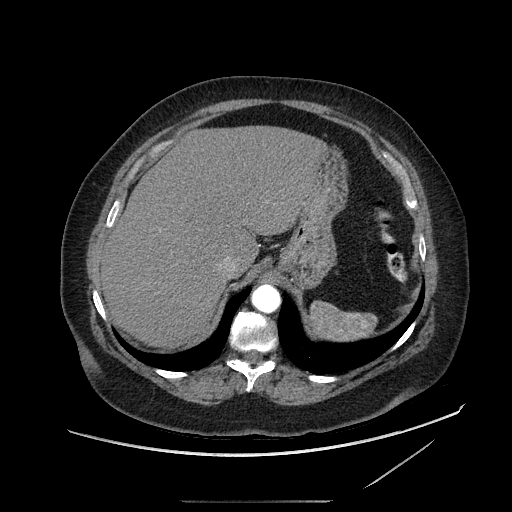

[Series 9: portal venous (id) · axial · portal-venous · 0.86mm/px · z∈[-124,-47]mm · 2 of 93 slices shown, 5 images]
[im 31/93  soft-tissue]
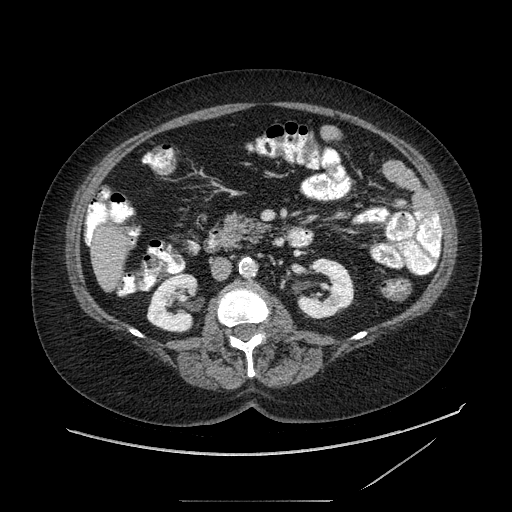
[im 31/93  lung]
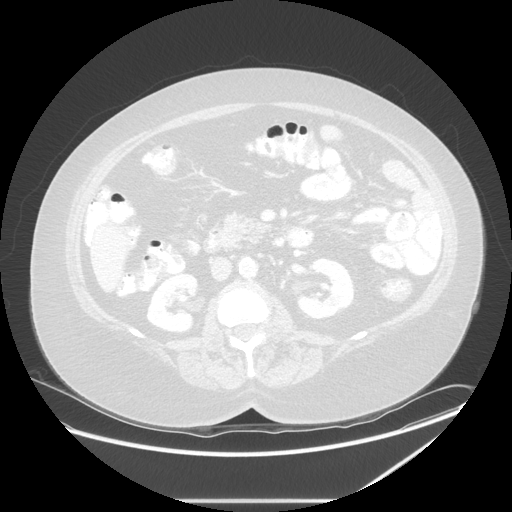
[im 31/93  bone]
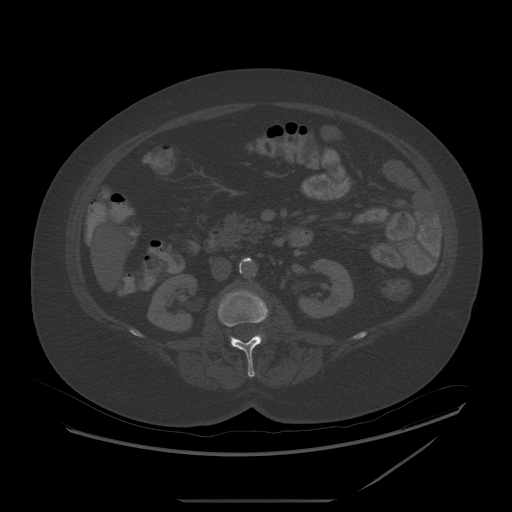
[im 62/93  soft-tissue]
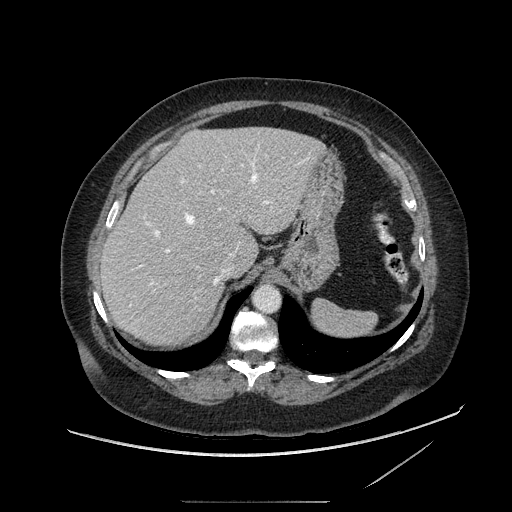
[im 62/93  lung]
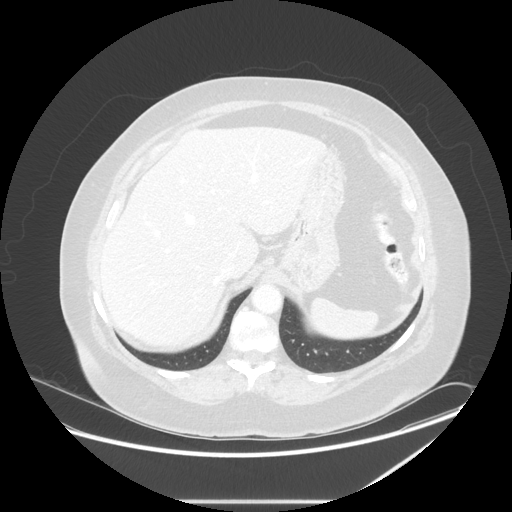

[Series 602: sagittal body · sagittal · 0.86mm/px · 6 of 172 slices shown (1 of 2)]
[im 25/172  soft-tissue]
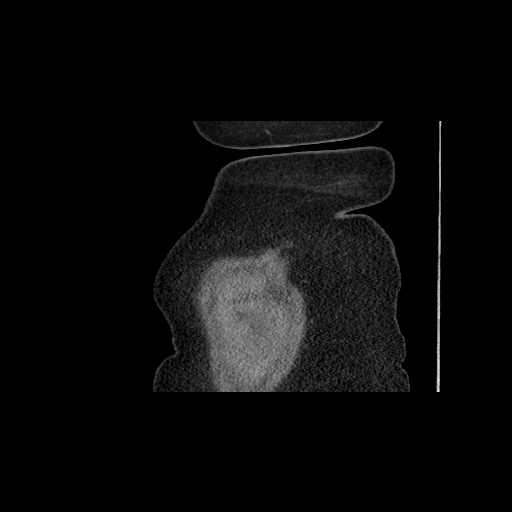
[im 49/172  soft-tissue]
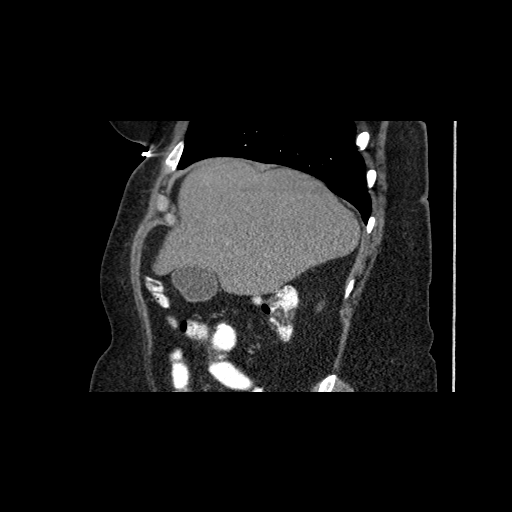
[im 74/172  soft-tissue]
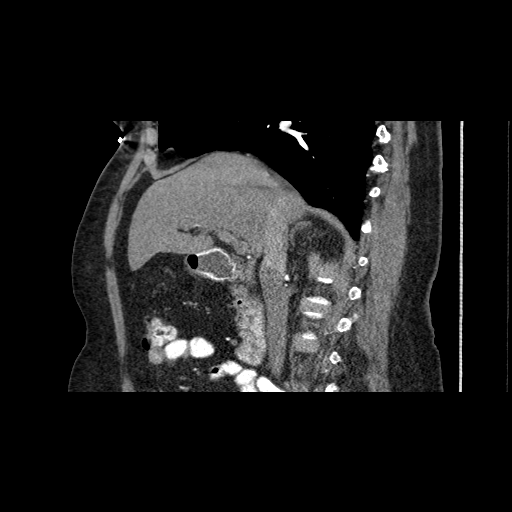
[im 98/172  soft-tissue]
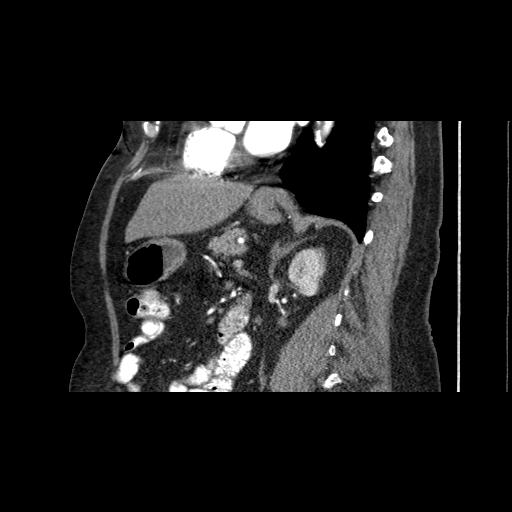
[im 123/172  soft-tissue]
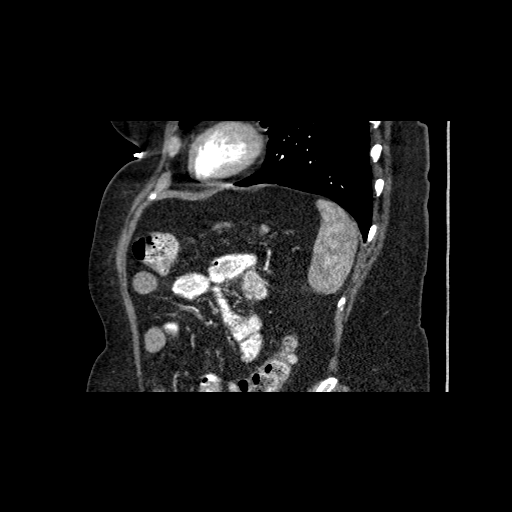
[im 147/172  soft-tissue]
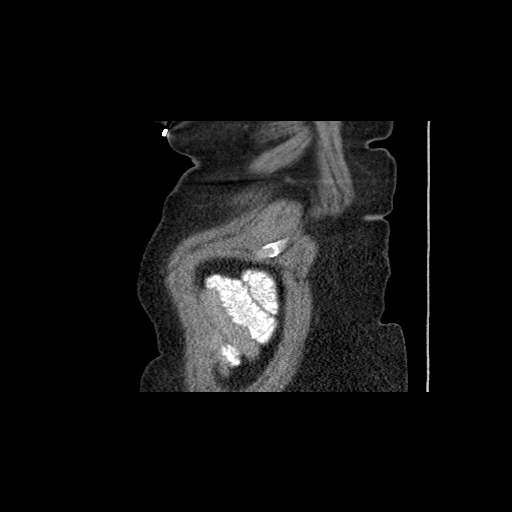

[Series 605: sagittal body · sagittal · 0.86mm/px · 6 of 172 slices shown (2 of 2)]
[im 25/172  soft-tissue]
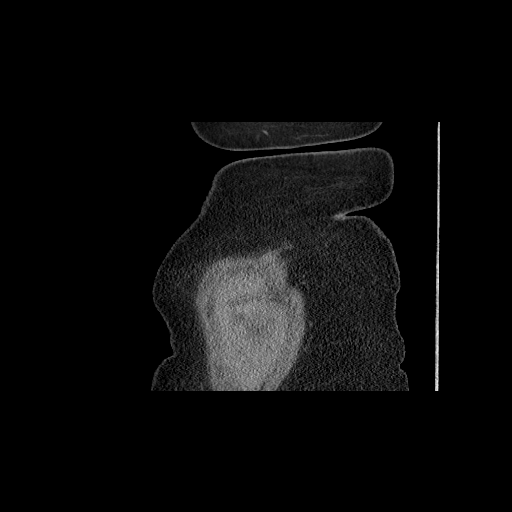
[im 49/172  soft-tissue]
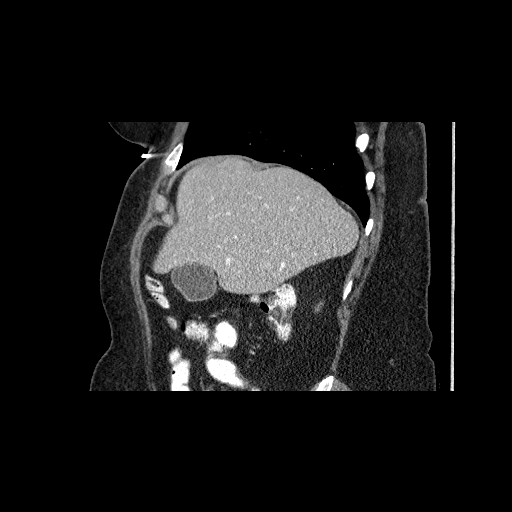
[im 74/172  soft-tissue]
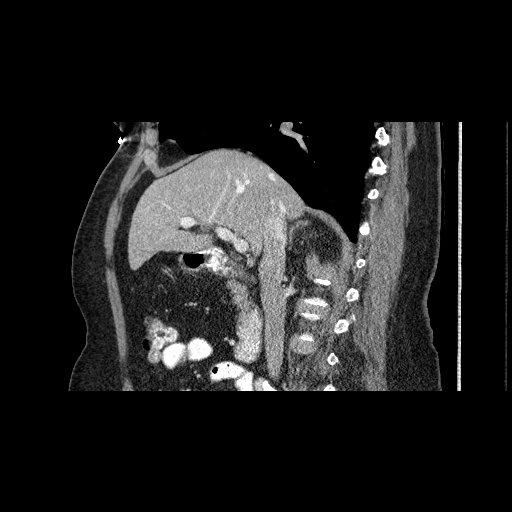
[im 98/172  soft-tissue]
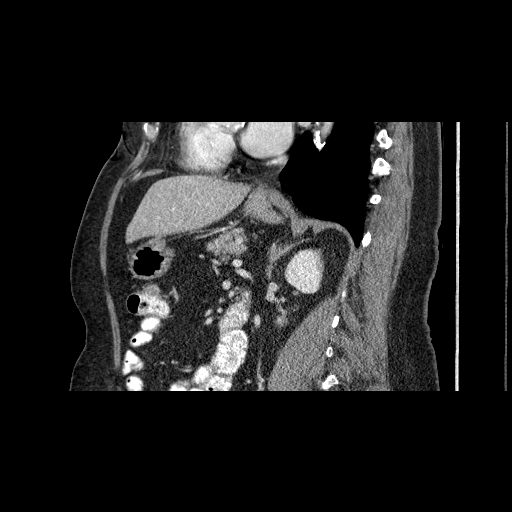
[im 123/172  soft-tissue]
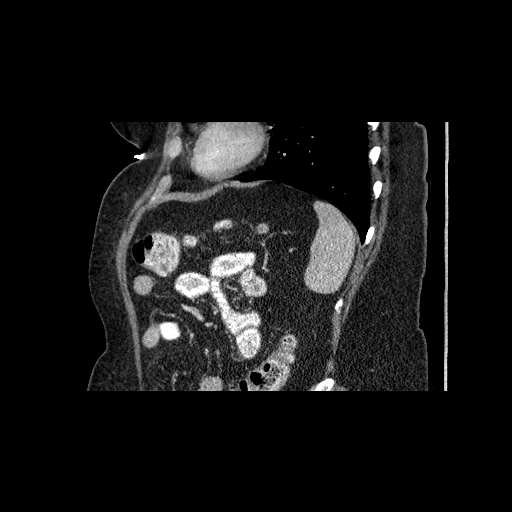
[im 147/172  soft-tissue]
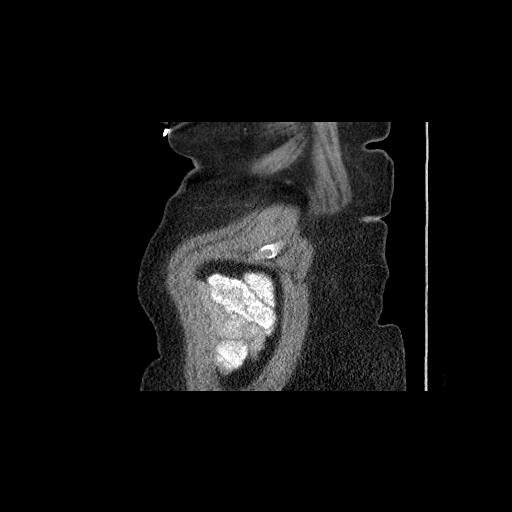

[16 of 36 positions shown; findings below may reference images not displayed]

FINDINGS: Lower chest: No acute abnormality.

Hepatobiliary: No focal liver abnormality. The gallbladder appears
normal. No biliary dilatation.

Pancreas: Unremarkable. No pancreatic ductal dilatation or
surrounding inflammatory changes.

Spleen: Normal in size without focal abnormality.

Adrenals/Urinary Tract: The adrenal glands are normal. Small stone
within the upper pole of right kidney measures 3 mm. Within the
inferior pole the left kidney there is a 2 mm stone, image 39 of
series 2. 8 mm low density structure within the posterior cortex of
left kidney is too small to reliably characterize. Small low density
structure within the inferior pole of right kidney is too small to
characterize measuring 4 mm. No mass or hydronephrosis.

Stomach/Bowel: Small hiatal hernia. The small bowel loops have a
normal course and caliber. No pathologic dilatation of the
visualized portions of the colon.

Vascular/Lymphatic: Aortic atherosclerosis. No aneurysm. No enlarged
retroperitoneal or mesenteric adenopathy.

Other: Ventral abdominal wall hernia is identified which contains a
nonobstructed loop of small bowel, image number 93 of series 9.

Musculoskeletal: Degenerative disc disease is noted within the
lumbar spine.
IMPRESSION: 1. No acute findings within the abdomen. No explanation for abnormal
liver function tests.
2.  Aortic Atherosclerosis ([id number].J).
3. Ventral abdominal wall hernia contains a nonobstructed loop of
small bowel.
4. Nonobstructing renal calculi.

## 2019-03-17 DIAGNOSIS — E118 Type 2 diabetes mellitus with unspecified complications: Secondary | ICD-10-CM | POA: Diagnosis not present

## 2019-03-17 DIAGNOSIS — Z794 Long term (current) use of insulin: Secondary | ICD-10-CM | POA: Diagnosis not present

## 2019-04-14 ENCOUNTER — Other Ambulatory Visit: Payer: Self-pay | Admitting: Cardiology

## 2019-04-22 DIAGNOSIS — E118 Type 2 diabetes mellitus with unspecified complications: Secondary | ICD-10-CM | POA: Diagnosis not present

## 2019-04-22 DIAGNOSIS — Z794 Long term (current) use of insulin: Secondary | ICD-10-CM | POA: Diagnosis not present

## 2019-05-12 ENCOUNTER — Other Ambulatory Visit: Payer: Self-pay | Admitting: Cardiology

## 2019-05-22 DIAGNOSIS — Z794 Long term (current) use of insulin: Secondary | ICD-10-CM | POA: Diagnosis not present

## 2019-05-22 DIAGNOSIS — E118 Type 2 diabetes mellitus with unspecified complications: Secondary | ICD-10-CM | POA: Diagnosis not present

## 2019-05-26 DIAGNOSIS — Z794 Long term (current) use of insulin: Secondary | ICD-10-CM | POA: Diagnosis not present

## 2019-05-26 DIAGNOSIS — E118 Type 2 diabetes mellitus with unspecified complications: Secondary | ICD-10-CM | POA: Diagnosis not present

## 2019-06-22 DIAGNOSIS — Z794 Long term (current) use of insulin: Secondary | ICD-10-CM | POA: Diagnosis not present

## 2019-06-22 DIAGNOSIS — E118 Type 2 diabetes mellitus with unspecified complications: Secondary | ICD-10-CM | POA: Diagnosis not present

## 2019-07-03 DIAGNOSIS — R300 Dysuria: Secondary | ICD-10-CM | POA: Diagnosis not present

## 2019-07-03 DIAGNOSIS — M25461 Effusion, right knee: Secondary | ICD-10-CM | POA: Diagnosis not present

## 2019-07-03 DIAGNOSIS — N183 Chronic kidney disease, stage 3 unspecified: Secondary | ICD-10-CM | POA: Diagnosis not present

## 2019-07-03 DIAGNOSIS — M1711 Unilateral primary osteoarthritis, right knee: Secondary | ICD-10-CM | POA: Diagnosis not present

## 2019-07-03 DIAGNOSIS — R350 Frequency of micturition: Secondary | ICD-10-CM | POA: Diagnosis not present

## 2019-07-03 DIAGNOSIS — G2581 Restless legs syndrome: Secondary | ICD-10-CM | POA: Diagnosis not present

## 2019-07-03 DIAGNOSIS — M545 Low back pain: Secondary | ICD-10-CM | POA: Diagnosis not present

## 2019-07-03 DIAGNOSIS — M25561 Pain in right knee: Secondary | ICD-10-CM | POA: Diagnosis not present

## 2019-07-17 DIAGNOSIS — Z794 Long term (current) use of insulin: Secondary | ICD-10-CM | POA: Diagnosis not present

## 2019-07-17 DIAGNOSIS — E118 Type 2 diabetes mellitus with unspecified complications: Secondary | ICD-10-CM | POA: Diagnosis not present

## 2019-08-17 DIAGNOSIS — E118 Type 2 diabetes mellitus with unspecified complications: Secondary | ICD-10-CM | POA: Diagnosis not present

## 2019-08-17 DIAGNOSIS — Z794 Long term (current) use of insulin: Secondary | ICD-10-CM | POA: Diagnosis not present

## 2019-09-11 DIAGNOSIS — E785 Hyperlipidemia, unspecified: Secondary | ICD-10-CM | POA: Diagnosis not present

## 2019-09-11 DIAGNOSIS — N182 Chronic kidney disease, stage 2 (mild): Secondary | ICD-10-CM | POA: Diagnosis not present

## 2019-09-11 DIAGNOSIS — E1122 Type 2 diabetes mellitus with diabetic chronic kidney disease: Secondary | ICD-10-CM | POA: Diagnosis not present

## 2019-09-11 DIAGNOSIS — I129 Hypertensive chronic kidney disease with stage 1 through stage 4 chronic kidney disease, or unspecified chronic kidney disease: Secondary | ICD-10-CM | POA: Diagnosis not present

## 2019-09-11 DIAGNOSIS — R300 Dysuria: Secondary | ICD-10-CM | POA: Diagnosis not present

## 2019-09-11 DIAGNOSIS — E039 Hypothyroidism, unspecified: Secondary | ICD-10-CM | POA: Diagnosis not present

## 2019-09-11 DIAGNOSIS — E1165 Type 2 diabetes mellitus with hyperglycemia: Secondary | ICD-10-CM | POA: Diagnosis not present

## 2019-09-17 DIAGNOSIS — E118 Type 2 diabetes mellitus with unspecified complications: Secondary | ICD-10-CM | POA: Diagnosis not present

## 2019-09-17 DIAGNOSIS — Z794 Long term (current) use of insulin: Secondary | ICD-10-CM | POA: Diagnosis not present

## 2019-10-04 IMAGING — CT CT ABD-PELV W/ CM
2 of 5 series · 16 of 46 positions shown, 18 images · IV contrast (iopamidol)
Comparison: 05/18/2016, 06/08/2015 and earlier.

CLINICAL DATA: 76-year-old with acute onset of left lower quadrant
abdominal pain. Surgical history includes hysterectomy and
appendectomy.

EXAM:
CT ABDOMEN AND PELVIS WITH CONTRAST
TECHNIQUE: Multidetector CT imaging of the abdomen and pelvis was performed
using the standard protocol following bolus administration of
intravenous contrast.
CONTRAST:  100mL [id number] IOPAMIDOL INJECTION 61% IV. Oral
contrast was not administered.

[Series 5: a/p w/ 5mm · axial · 0.98mm/px · z∈[+852,+1277]mm · 13 of 95 slices shown, 15 images]
[im 5/95  soft-tissue]
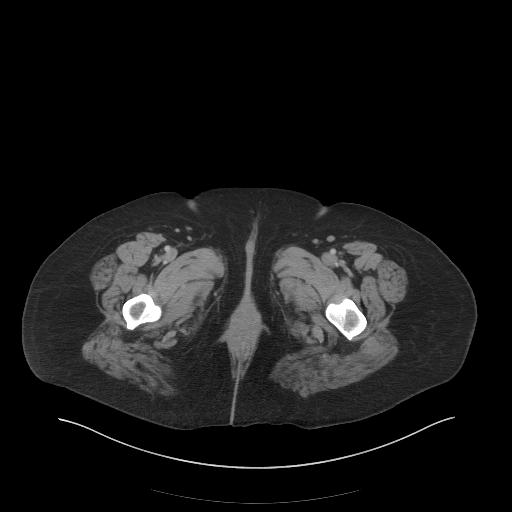
[im 5/95  bone]
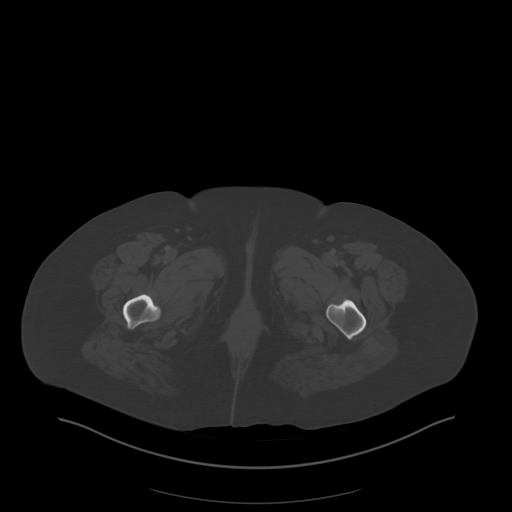
[im 15/95  soft-tissue]
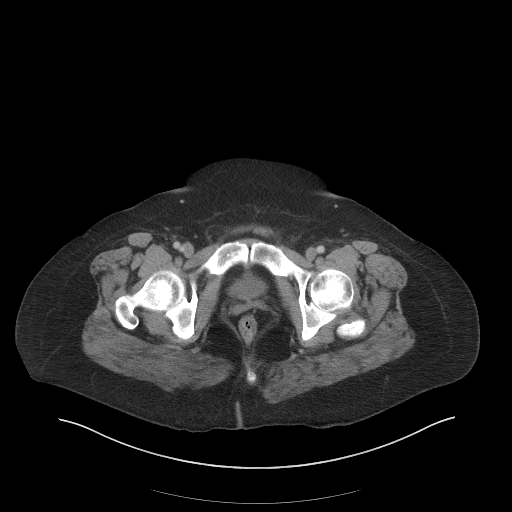
[im 19/95  soft-tissue]
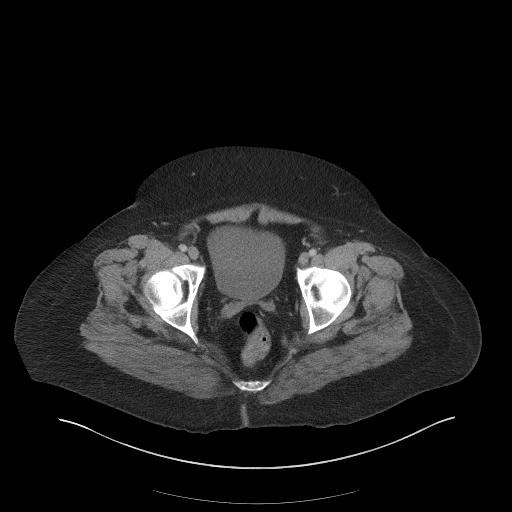
[im 29/95  soft-tissue]
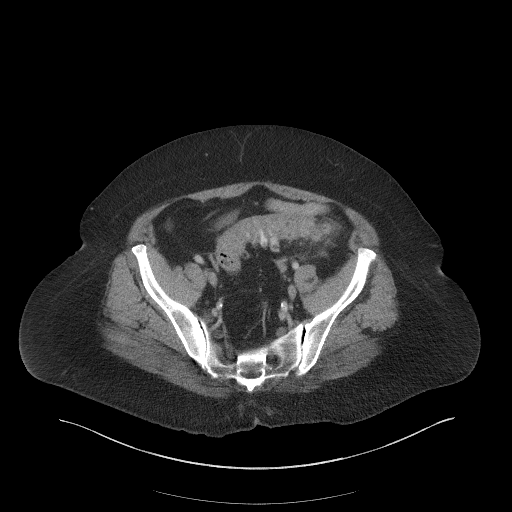
[im 33/95  soft-tissue]
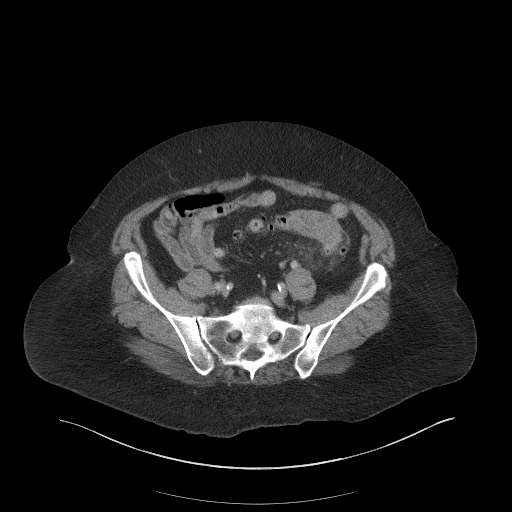
[im 43/95  soft-tissue]
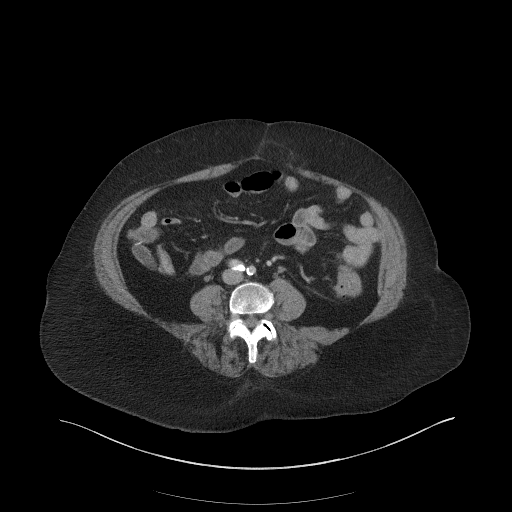
[im 48/95  soft-tissue]
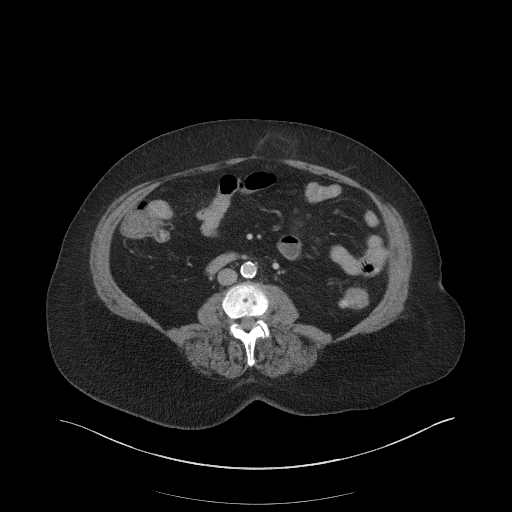
[im 52/95  soft-tissue]
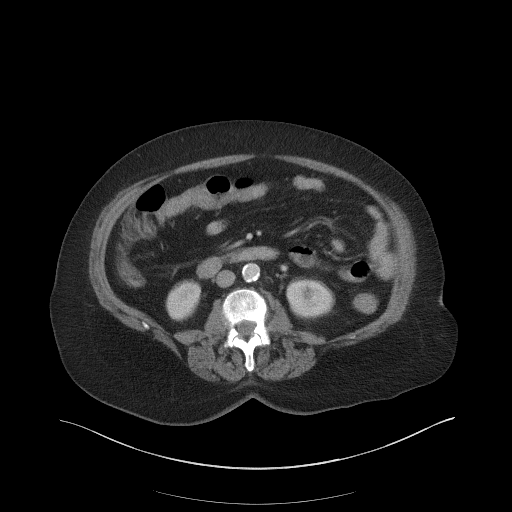
[im 62/95  soft-tissue]
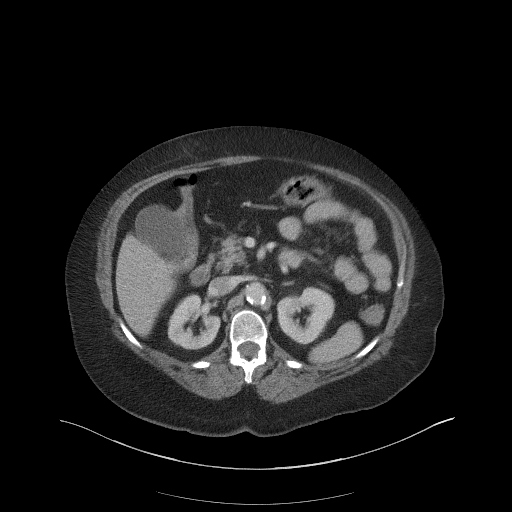
[im 62/95  bone]
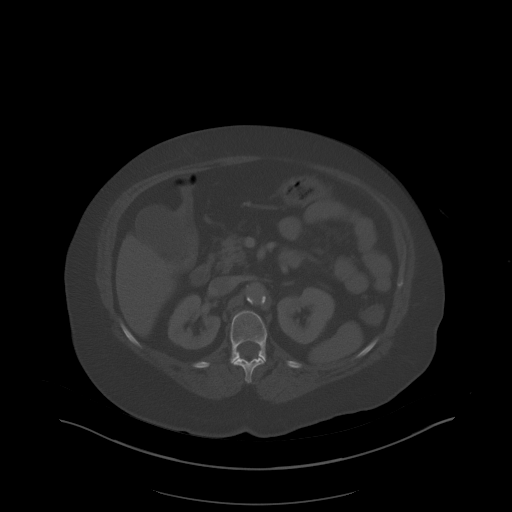
[im 66/95  soft-tissue]
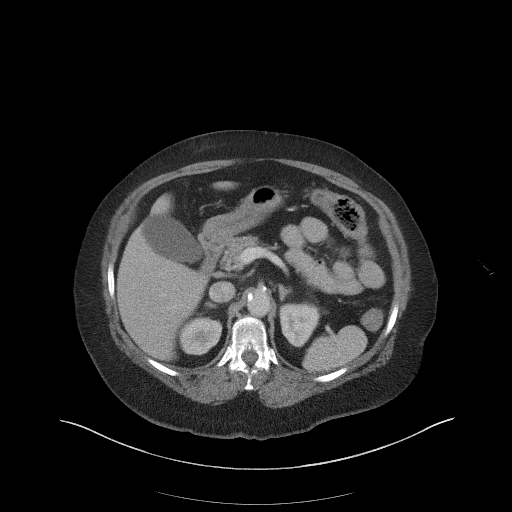
[im 76/95  soft-tissue]
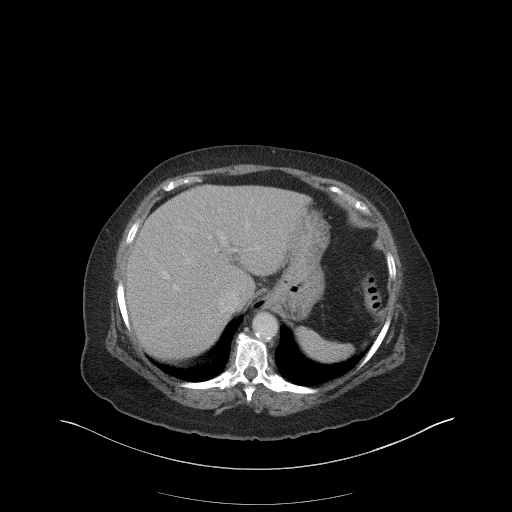
[im 80/95  soft-tissue]
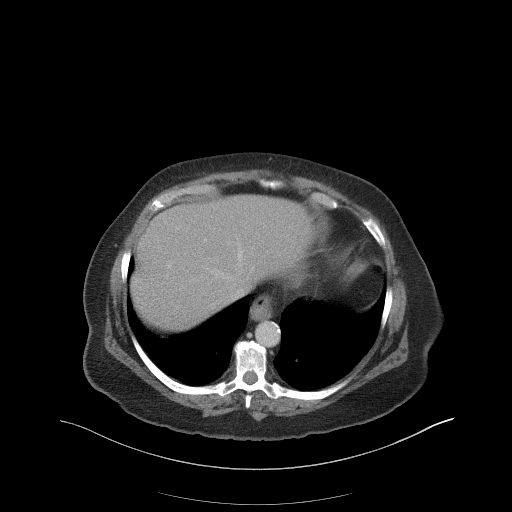
[im 90/95  soft-tissue]
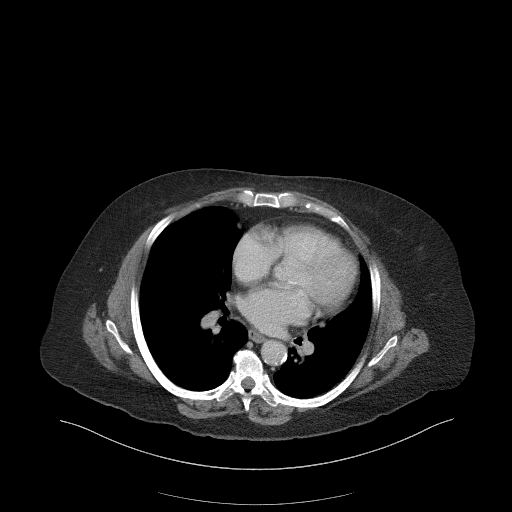

[Series 8: a/p w/ cor · coronal · 0.87mm/px · 3 of 178 slices shown]
[im 60/178  soft-tissue]
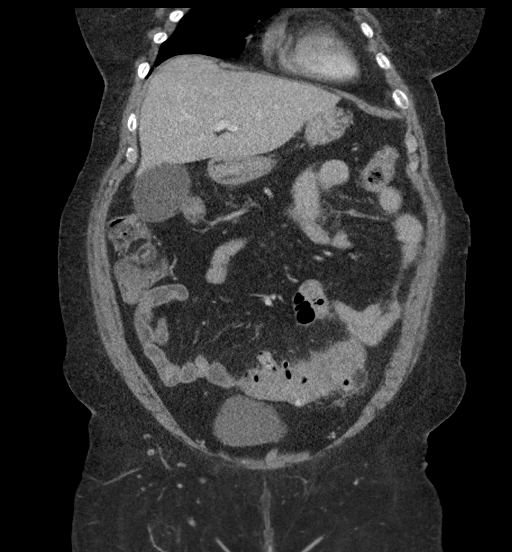
[im 79/178  soft-tissue]
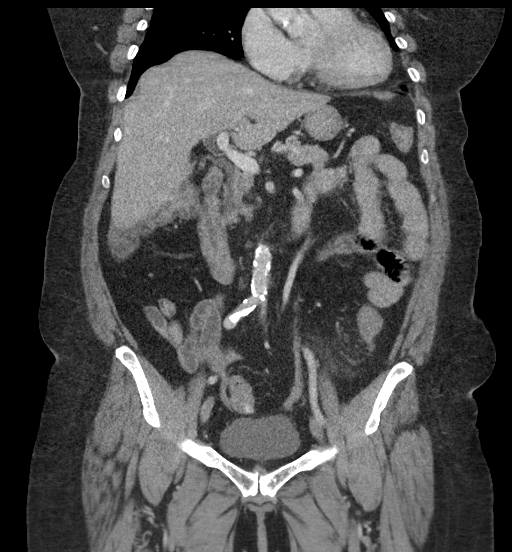
[im 99/178  soft-tissue]
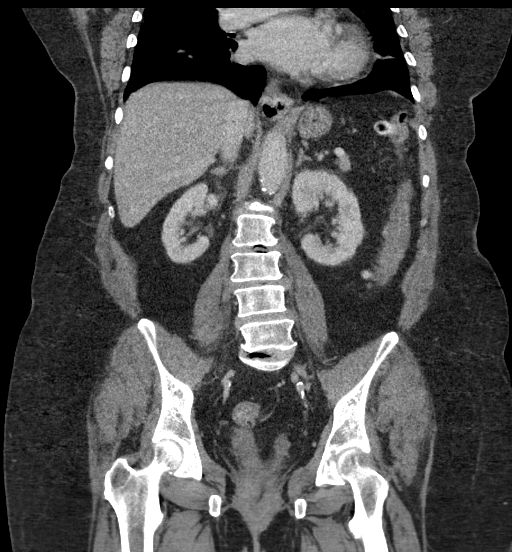

[16 of 46 positions shown; findings below may reference images not displayed]

FINDINGS: Lower chest: Stable scarring in the right middle lobe and lingula.
Scattered areas of focal hyperlucency in the visualized lung bases.
Heart mildly enlarged, unchanged. Aortic annular calcification. LAD
coronary atherosclerosis.

Hepatobiliary: Liver normal in size and appearance. Gallbladder
normal in appearance without calcified gallstones. No biliary ductal
dilation.

Pancreas: Normal in appearance without evidence of mass, ductal
dilation, or inflammation.

Spleen: Normal in size and appearance.

Adrenals/Urinary Tract: Normal appearing adrenal glands. Kidneys
normal in size and appearance without focal parenchymal abnormality.
No evidence of urinary tract calculi or obstruction.
Normal-appearing urinary bladder.

Stomach/Bowel: Small hiatal hernia. Stomach decompressed and
otherwise normal in appearance. Normal-appearing small bowel.
Diffuse colonic diverticulosis, most severe in the sigmoid colon,
with evidence of edema/inflammation adjacent to the proximal sigmoid
colon. No evidence of extraluminal gas or abscess. Ascending colon
completely decompressed. Mobile cecum positioned in the right upper
quadrant. Appendix surgically absent.

Vascular/Lymphatic: Severe aortoiliofemoral atherosclerosis without
evidence of aneurysm. Normal-appearing portal venous and systemic
venous systems.

No pathologic lymphadenopathy.

Reproductive: Surgically absent uterus.  No adnexal masses.

Other: Periumbilical anterior abdominal wall hernia containing fat.

Musculoskeletal: Multilevel degenerative disc disease and
spondylosis throughout the visualized mid and lower thoracic spine
and lumbar spine. Facet degenerative changes at L4-5 and L5-S1.
Central disc protrusion at L5-S1. Multifactorial spinal stenosis at
L4-5. No acute osseous abnormality.
IMPRESSION: 1. Acute diverticulitis involving the proximal sigmoid colon. No
evidence of perforation or abscess.
2. Small hiatal hernia.
3. Periumbilical anterior abdominal wall hernia containing fat.
4. Musculoskeletal findings as above, the most significant finding
that of multifactorial spinal stenosis at L4-5.
5.  Aortic Atherosclerosis ([id number])

## 2019-10-15 DIAGNOSIS — Z794 Long term (current) use of insulin: Secondary | ICD-10-CM | POA: Diagnosis not present

## 2019-10-15 DIAGNOSIS — E118 Type 2 diabetes mellitus with unspecified complications: Secondary | ICD-10-CM | POA: Diagnosis not present

## 2019-10-29 ENCOUNTER — Other Ambulatory Visit: Payer: Self-pay | Admitting: Cardiology

## 2019-11-12 DIAGNOSIS — Z23 Encounter for immunization: Secondary | ICD-10-CM | POA: Diagnosis not present

## 2019-11-15 DIAGNOSIS — Z794 Long term (current) use of insulin: Secondary | ICD-10-CM | POA: Diagnosis not present

## 2019-11-15 DIAGNOSIS — E118 Type 2 diabetes mellitus with unspecified complications: Secondary | ICD-10-CM | POA: Diagnosis not present

## 2019-12-08 ENCOUNTER — Other Ambulatory Visit: Payer: Self-pay | Admitting: Cardiology

## 2019-12-15 ENCOUNTER — Other Ambulatory Visit: Payer: Self-pay | Admitting: Cardiology

## 2019-12-15 DIAGNOSIS — E118 Type 2 diabetes mellitus with unspecified complications: Secondary | ICD-10-CM | POA: Diagnosis not present

## 2019-12-15 DIAGNOSIS — Z794 Long term (current) use of insulin: Secondary | ICD-10-CM | POA: Diagnosis not present

## 2019-12-29 ENCOUNTER — Other Ambulatory Visit: Payer: Self-pay | Admitting: Cardiology

## 2020-01-16 DIAGNOSIS — E118 Type 2 diabetes mellitus with unspecified complications: Secondary | ICD-10-CM | POA: Diagnosis not present

## 2020-01-16 DIAGNOSIS — Z794 Long term (current) use of insulin: Secondary | ICD-10-CM | POA: Diagnosis not present

## 2020-02-16 DIAGNOSIS — E118 Type 2 diabetes mellitus with unspecified complications: Secondary | ICD-10-CM | POA: Diagnosis not present

## 2020-02-16 DIAGNOSIS — Z794 Long term (current) use of insulin: Secondary | ICD-10-CM | POA: Diagnosis not present

## 2020-03-02 DIAGNOSIS — L821 Other seborrheic keratosis: Secondary | ICD-10-CM | POA: Diagnosis not present

## 2020-03-15 DIAGNOSIS — Z794 Long term (current) use of insulin: Secondary | ICD-10-CM | POA: Diagnosis not present

## 2020-03-15 DIAGNOSIS — E118 Type 2 diabetes mellitus with unspecified complications: Secondary | ICD-10-CM | POA: Diagnosis not present

## 2020-04-22 DIAGNOSIS — Z9641 Presence of insulin pump (external) (internal): Secondary | ICD-10-CM | POA: Diagnosis not present

## 2020-04-22 DIAGNOSIS — E039 Hypothyroidism, unspecified: Secondary | ICD-10-CM | POA: Diagnosis not present

## 2020-04-22 DIAGNOSIS — I129 Hypertensive chronic kidney disease with stage 1 through stage 4 chronic kidney disease, or unspecified chronic kidney disease: Secondary | ICD-10-CM | POA: Diagnosis not present

## 2020-04-22 DIAGNOSIS — E1165 Type 2 diabetes mellitus with hyperglycemia: Secondary | ICD-10-CM | POA: Diagnosis not present

## 2020-04-22 DIAGNOSIS — N182 Chronic kidney disease, stage 2 (mild): Secondary | ICD-10-CM | POA: Diagnosis not present

## 2020-04-22 DIAGNOSIS — E785 Hyperlipidemia, unspecified: Secondary | ICD-10-CM | POA: Diagnosis not present

## 2020-04-23 ENCOUNTER — Other Ambulatory Visit: Payer: Self-pay | Admitting: Cardiology

## 2020-04-23 DIAGNOSIS — E039 Hypothyroidism, unspecified: Secondary | ICD-10-CM | POA: Diagnosis not present

## 2020-04-23 DIAGNOSIS — E1165 Type 2 diabetes mellitus with hyperglycemia: Secondary | ICD-10-CM | POA: Diagnosis not present

## 2020-05-19 DIAGNOSIS — Z794 Long term (current) use of insulin: Secondary | ICD-10-CM | POA: Diagnosis not present

## 2020-05-19 DIAGNOSIS — E118 Type 2 diabetes mellitus with unspecified complications: Secondary | ICD-10-CM | POA: Diagnosis not present

## 2020-06-19 DIAGNOSIS — Z794 Long term (current) use of insulin: Secondary | ICD-10-CM | POA: Diagnosis not present

## 2020-06-19 DIAGNOSIS — E118 Type 2 diabetes mellitus with unspecified complications: Secondary | ICD-10-CM | POA: Diagnosis not present

## 2020-07-14 DIAGNOSIS — H40023 Open angle with borderline findings, high risk, bilateral: Secondary | ICD-10-CM | POA: Diagnosis not present

## 2020-07-14 DIAGNOSIS — H2513 Age-related nuclear cataract, bilateral: Secondary | ICD-10-CM | POA: Diagnosis not present

## 2020-07-19 DIAGNOSIS — Z794 Long term (current) use of insulin: Secondary | ICD-10-CM | POA: Diagnosis not present

## 2020-07-19 DIAGNOSIS — E118 Type 2 diabetes mellitus with unspecified complications: Secondary | ICD-10-CM | POA: Diagnosis not present

## 2020-07-26 ENCOUNTER — Other Ambulatory Visit: Payer: Self-pay | Admitting: Cardiology

## 2020-08-13 DIAGNOSIS — Z794 Long term (current) use of insulin: Secondary | ICD-10-CM | POA: Diagnosis not present

## 2020-08-13 DIAGNOSIS — E118 Type 2 diabetes mellitus with unspecified complications: Secondary | ICD-10-CM | POA: Diagnosis not present

## 2020-09-07 DIAGNOSIS — H2589 Other age-related cataract: Secondary | ICD-10-CM | POA: Diagnosis not present

## 2020-09-07 DIAGNOSIS — H25043 Posterior subcapsular polar age-related cataract, bilateral: Secondary | ICD-10-CM | POA: Diagnosis not present

## 2020-09-07 DIAGNOSIS — E0500 Thyrotoxicosis with diffuse goiter without thyrotoxic crisis or storm: Secondary | ICD-10-CM | POA: Diagnosis not present

## 2020-09-07 DIAGNOSIS — H25013 Cortical age-related cataract, bilateral: Secondary | ICD-10-CM | POA: Diagnosis not present

## 2020-09-07 DIAGNOSIS — H2513 Age-related nuclear cataract, bilateral: Secondary | ICD-10-CM | POA: Diagnosis not present

## 2020-09-07 DIAGNOSIS — H2512 Age-related nuclear cataract, left eye: Secondary | ICD-10-CM | POA: Diagnosis not present

## 2020-09-13 DIAGNOSIS — Z794 Long term (current) use of insulin: Secondary | ICD-10-CM | POA: Diagnosis not present

## 2020-09-13 DIAGNOSIS — E118 Type 2 diabetes mellitus with unspecified complications: Secondary | ICD-10-CM | POA: Diagnosis not present

## 2020-10-13 DIAGNOSIS — Z794 Long term (current) use of insulin: Secondary | ICD-10-CM | POA: Diagnosis not present

## 2020-10-13 DIAGNOSIS — E118 Type 2 diabetes mellitus with unspecified complications: Secondary | ICD-10-CM | POA: Diagnosis not present

## 2020-11-09 DIAGNOSIS — E118 Type 2 diabetes mellitus with unspecified complications: Secondary | ICD-10-CM | POA: Diagnosis not present

## 2020-11-09 DIAGNOSIS — Z794 Long term (current) use of insulin: Secondary | ICD-10-CM | POA: Diagnosis not present

## 2020-11-11 DIAGNOSIS — E785 Hyperlipidemia, unspecified: Secondary | ICD-10-CM | POA: Diagnosis not present

## 2020-11-11 DIAGNOSIS — E1165 Type 2 diabetes mellitus with hyperglycemia: Secondary | ICD-10-CM | POA: Diagnosis not present

## 2020-11-11 DIAGNOSIS — E1122 Type 2 diabetes mellitus with diabetic chronic kidney disease: Secondary | ICD-10-CM | POA: Diagnosis not present

## 2020-11-11 DIAGNOSIS — E039 Hypothyroidism, unspecified: Secondary | ICD-10-CM | POA: Diagnosis not present

## 2020-11-11 DIAGNOSIS — I129 Hypertensive chronic kidney disease with stage 1 through stage 4 chronic kidney disease, or unspecified chronic kidney disease: Secondary | ICD-10-CM | POA: Diagnosis not present

## 2020-11-11 DIAGNOSIS — Z9641 Presence of insulin pump (external) (internal): Secondary | ICD-10-CM | POA: Diagnosis not present

## 2020-11-15 DIAGNOSIS — M79604 Pain in right leg: Secondary | ICD-10-CM | POA: Diagnosis not present

## 2020-11-15 DIAGNOSIS — E039 Hypothyroidism, unspecified: Secondary | ICD-10-CM | POA: Diagnosis not present

## 2020-11-15 DIAGNOSIS — M79601 Pain in right arm: Secondary | ICD-10-CM | POA: Diagnosis not present

## 2020-11-15 DIAGNOSIS — E1165 Type 2 diabetes mellitus with hyperglycemia: Secondary | ICD-10-CM | POA: Diagnosis not present

## 2020-11-15 DIAGNOSIS — M25511 Pain in right shoulder: Secondary | ICD-10-CM | POA: Diagnosis not present

## 2020-11-15 DIAGNOSIS — K449 Diaphragmatic hernia without obstruction or gangrene: Secondary | ICD-10-CM | POA: Diagnosis not present

## 2020-11-15 DIAGNOSIS — E860 Dehydration: Secondary | ICD-10-CM | POA: Diagnosis not present

## 2020-11-15 DIAGNOSIS — R519 Headache, unspecified: Secondary | ICD-10-CM | POA: Diagnosis not present

## 2020-11-15 DIAGNOSIS — R9431 Abnormal electrocardiogram [ECG] [EKG]: Secondary | ICD-10-CM | POA: Diagnosis not present

## 2020-11-15 DIAGNOSIS — I10 Essential (primary) hypertension: Secondary | ICD-10-CM | POA: Diagnosis not present

## 2020-11-15 DIAGNOSIS — Z7982 Long term (current) use of aspirin: Secondary | ICD-10-CM | POA: Diagnosis not present

## 2020-11-15 DIAGNOSIS — H0520 Unspecified exophthalmos: Secondary | ICD-10-CM | POA: Diagnosis not present

## 2020-11-15 DIAGNOSIS — Z9181 History of falling: Secondary | ICD-10-CM | POA: Diagnosis not present

## 2020-11-15 DIAGNOSIS — R531 Weakness: Secondary | ICD-10-CM | POA: Diagnosis not present

## 2020-11-15 DIAGNOSIS — Z9641 Presence of insulin pump (external) (internal): Secondary | ICD-10-CM | POA: Diagnosis not present

## 2020-11-15 DIAGNOSIS — J984 Other disorders of lung: Secondary | ICD-10-CM | POA: Diagnosis not present

## 2020-11-15 DIAGNOSIS — E119 Type 2 diabetes mellitus without complications: Secondary | ICD-10-CM | POA: Diagnosis not present

## 2020-11-15 DIAGNOSIS — Z79899 Other long term (current) drug therapy: Secondary | ICD-10-CM | POA: Diagnosis not present

## 2020-11-15 DIAGNOSIS — R5081 Fever presenting with conditions classified elsewhere: Secondary | ICD-10-CM | POA: Diagnosis not present

## 2020-11-15 DIAGNOSIS — E871 Hypo-osmolality and hyponatremia: Secondary | ICD-10-CM | POA: Diagnosis not present

## 2020-11-15 DIAGNOSIS — G2581 Restless legs syndrome: Secondary | ICD-10-CM | POA: Diagnosis not present

## 2020-11-15 DIAGNOSIS — I340 Nonrheumatic mitral (valve) insufficiency: Secondary | ICD-10-CM | POA: Diagnosis not present

## 2020-11-15 DIAGNOSIS — I493 Ventricular premature depolarization: Secondary | ICD-10-CM | POA: Diagnosis not present

## 2020-11-15 DIAGNOSIS — N179 Acute kidney failure, unspecified: Secondary | ICD-10-CM | POA: Diagnosis not present

## 2020-11-15 DIAGNOSIS — R410 Disorientation, unspecified: Secondary | ICD-10-CM | POA: Diagnosis not present

## 2020-11-15 DIAGNOSIS — S0990XA Unspecified injury of head, initial encounter: Secondary | ICD-10-CM | POA: Diagnosis not present

## 2020-11-15 DIAGNOSIS — I272 Pulmonary hypertension, unspecified: Secondary | ICD-10-CM | POA: Diagnosis not present

## 2020-11-15 DIAGNOSIS — R911 Solitary pulmonary nodule: Secondary | ICD-10-CM | POA: Diagnosis not present

## 2020-11-15 DIAGNOSIS — Z794 Long term (current) use of insulin: Secondary | ICD-10-CM | POA: Diagnosis not present

## 2020-11-15 DIAGNOSIS — R739 Hyperglycemia, unspecified: Secondary | ICD-10-CM | POA: Diagnosis not present

## 2020-11-15 DIAGNOSIS — I517 Cardiomegaly: Secondary | ICD-10-CM | POA: Diagnosis not present

## 2020-11-15 DIAGNOSIS — R079 Chest pain, unspecified: Secondary | ICD-10-CM | POA: Diagnosis not present

## 2020-11-15 DIAGNOSIS — I362 Nonrheumatic tricuspid (valve) stenosis with insufficiency: Secondary | ICD-10-CM | POA: Diagnosis not present

## 2020-11-15 DIAGNOSIS — E785 Hyperlipidemia, unspecified: Secondary | ICD-10-CM | POA: Diagnosis not present

## 2020-11-15 DIAGNOSIS — E872 Acidosis, unspecified: Secondary | ICD-10-CM | POA: Diagnosis not present

## 2020-11-15 DIAGNOSIS — I959 Hypotension, unspecified: Secondary | ICD-10-CM | POA: Diagnosis not present

## 2020-11-15 DIAGNOSIS — M79605 Pain in left leg: Secondary | ICD-10-CM | POA: Diagnosis not present

## 2020-11-15 DIAGNOSIS — E875 Hyperkalemia: Secondary | ICD-10-CM | POA: Diagnosis not present

## 2020-11-15 DIAGNOSIS — R6889 Other general symptoms and signs: Secondary | ICD-10-CM | POA: Diagnosis not present

## 2020-11-15 DIAGNOSIS — Z20822 Contact with and (suspected) exposure to covid-19: Secondary | ICD-10-CM | POA: Diagnosis not present

## 2020-11-15 DIAGNOSIS — Z743 Need for continuous supervision: Secondary | ICD-10-CM | POA: Diagnosis not present

## 2020-11-15 DIAGNOSIS — J069 Acute upper respiratory infection, unspecified: Secondary | ICD-10-CM | POA: Diagnosis not present

## 2020-11-15 DIAGNOSIS — Z043 Encounter for examination and observation following other accident: Secondary | ICD-10-CM | POA: Diagnosis not present

## 2020-11-15 DIAGNOSIS — G9341 Metabolic encephalopathy: Secondary | ICD-10-CM | POA: Diagnosis not present

## 2020-11-15 DIAGNOSIS — R52 Pain, unspecified: Secondary | ICD-10-CM | POA: Diagnosis not present

## 2020-11-15 DIAGNOSIS — Z87891 Personal history of nicotine dependence: Secondary | ICD-10-CM | POA: Diagnosis not present

## 2020-11-17 DIAGNOSIS — K449 Diaphragmatic hernia without obstruction or gangrene: Secondary | ICD-10-CM | POA: Diagnosis not present

## 2020-11-17 DIAGNOSIS — I517 Cardiomegaly: Secondary | ICD-10-CM | POA: Diagnosis not present

## 2020-11-17 DIAGNOSIS — R911 Solitary pulmonary nodule: Secondary | ICD-10-CM | POA: Diagnosis not present

## 2020-11-17 DIAGNOSIS — R079 Chest pain, unspecified: Secondary | ICD-10-CM | POA: Diagnosis not present

## 2020-11-22 DIAGNOSIS — R079 Chest pain, unspecified: Secondary | ICD-10-CM | POA: Diagnosis not present

## 2020-12-06 DIAGNOSIS — R062 Wheezing: Secondary | ICD-10-CM | POA: Diagnosis not present

## 2020-12-06 DIAGNOSIS — J40 Bronchitis, not specified as acute or chronic: Secondary | ICD-10-CM | POA: Diagnosis not present

## 2020-12-06 DIAGNOSIS — Z09 Encounter for follow-up examination after completed treatment for conditions other than malignant neoplasm: Secondary | ICD-10-CM | POA: Diagnosis not present

## 2020-12-06 DIAGNOSIS — R42 Dizziness and giddiness: Secondary | ICD-10-CM | POA: Diagnosis not present

## 2020-12-06 DIAGNOSIS — R911 Solitary pulmonary nodule: Secondary | ICD-10-CM | POA: Diagnosis not present

## 2020-12-06 DIAGNOSIS — G309 Alzheimer's disease, unspecified: Secondary | ICD-10-CM | POA: Diagnosis not present

## 2020-12-06 DIAGNOSIS — Z9641 Presence of insulin pump (external) (internal): Secondary | ICD-10-CM | POA: Diagnosis not present

## 2020-12-06 DIAGNOSIS — E1122 Type 2 diabetes mellitus with diabetic chronic kidney disease: Secondary | ICD-10-CM | POA: Diagnosis not present

## 2020-12-06 DIAGNOSIS — Z87891 Personal history of nicotine dependence: Secondary | ICD-10-CM | POA: Diagnosis not present

## 2020-12-06 DIAGNOSIS — R0902 Hypoxemia: Secondary | ICD-10-CM | POA: Diagnosis not present

## 2020-12-06 DIAGNOSIS — J9600 Acute respiratory failure, unspecified whether with hypoxia or hypercapnia: Secondary | ICD-10-CM | POA: Diagnosis not present

## 2020-12-08 DIAGNOSIS — R911 Solitary pulmonary nodule: Secondary | ICD-10-CM | POA: Diagnosis not present

## 2020-12-09 DIAGNOSIS — E118 Type 2 diabetes mellitus with unspecified complications: Secondary | ICD-10-CM | POA: Diagnosis not present

## 2020-12-09 DIAGNOSIS — Z794 Long term (current) use of insulin: Secondary | ICD-10-CM | POA: Diagnosis not present

## 2021-01-06 DIAGNOSIS — J449 Chronic obstructive pulmonary disease, unspecified: Secondary | ICD-10-CM | POA: Diagnosis not present

## 2021-01-06 DIAGNOSIS — R0902 Hypoxemia: Secondary | ICD-10-CM | POA: Diagnosis not present

## 2021-01-06 DIAGNOSIS — R911 Solitary pulmonary nodule: Secondary | ICD-10-CM | POA: Diagnosis not present

## 2021-01-06 DIAGNOSIS — J40 Bronchitis, not specified as acute or chronic: Secondary | ICD-10-CM | POA: Diagnosis not present

## 2021-01-09 DIAGNOSIS — E118 Type 2 diabetes mellitus with unspecified complications: Secondary | ICD-10-CM | POA: Diagnosis not present

## 2021-01-09 DIAGNOSIS — Z794 Long term (current) use of insulin: Secondary | ICD-10-CM | POA: Diagnosis not present

## 2021-01-12 DIAGNOSIS — H2512 Age-related nuclear cataract, left eye: Secondary | ICD-10-CM | POA: Diagnosis not present

## 2021-01-13 DIAGNOSIS — H2511 Age-related nuclear cataract, right eye: Secondary | ICD-10-CM | POA: Diagnosis not present

## 2021-01-13 DIAGNOSIS — J9600 Acute respiratory failure, unspecified whether with hypoxia or hypercapnia: Secondary | ICD-10-CM | POA: Diagnosis not present

## 2021-01-26 DIAGNOSIS — H2511 Age-related nuclear cataract, right eye: Secondary | ICD-10-CM | POA: Diagnosis not present

## 2021-02-01 DIAGNOSIS — Z794 Long term (current) use of insulin: Secondary | ICD-10-CM | POA: Diagnosis not present

## 2021-02-01 DIAGNOSIS — E118 Type 2 diabetes mellitus with unspecified complications: Secondary | ICD-10-CM | POA: Diagnosis not present

## 2021-02-06 DIAGNOSIS — J40 Bronchitis, not specified as acute or chronic: Secondary | ICD-10-CM | POA: Diagnosis not present

## 2021-02-13 DIAGNOSIS — J9600 Acute respiratory failure, unspecified whether with hypoxia or hypercapnia: Secondary | ICD-10-CM | POA: Diagnosis not present

## 2021-03-03 DIAGNOSIS — E118 Type 2 diabetes mellitus with unspecified complications: Secondary | ICD-10-CM | POA: Diagnosis not present

## 2021-03-03 DIAGNOSIS — Z794 Long term (current) use of insulin: Secondary | ICD-10-CM | POA: Diagnosis not present

## 2021-03-06 DIAGNOSIS — J40 Bronchitis, not specified as acute or chronic: Secondary | ICD-10-CM | POA: Diagnosis not present

## 2021-03-13 DIAGNOSIS — J9600 Acute respiratory failure, unspecified whether with hypoxia or hypercapnia: Secondary | ICD-10-CM | POA: Diagnosis not present

## 2021-03-15 DIAGNOSIS — I700 Atherosclerosis of aorta: Secondary | ICD-10-CM | POA: Diagnosis not present

## 2021-03-15 DIAGNOSIS — R918 Other nonspecific abnormal finding of lung field: Secondary | ICD-10-CM | POA: Diagnosis not present

## 2021-03-15 DIAGNOSIS — R911 Solitary pulmonary nodule: Secondary | ICD-10-CM | POA: Diagnosis not present

## 2021-03-22 DIAGNOSIS — R0902 Hypoxemia: Secondary | ICD-10-CM | POA: Diagnosis not present

## 2021-03-22 DIAGNOSIS — J449 Chronic obstructive pulmonary disease, unspecified: Secondary | ICD-10-CM | POA: Diagnosis not present

## 2021-03-22 DIAGNOSIS — R911 Solitary pulmonary nodule: Secondary | ICD-10-CM | POA: Diagnosis not present

## 2021-04-02 DIAGNOSIS — E118 Type 2 diabetes mellitus with unspecified complications: Secondary | ICD-10-CM | POA: Diagnosis not present

## 2021-04-02 DIAGNOSIS — Z794 Long term (current) use of insulin: Secondary | ICD-10-CM | POA: Diagnosis not present

## 2021-04-06 DIAGNOSIS — J40 Bronchitis, not specified as acute or chronic: Secondary | ICD-10-CM | POA: Diagnosis not present

## 2021-04-13 DIAGNOSIS — J9600 Acute respiratory failure, unspecified whether with hypoxia or hypercapnia: Secondary | ICD-10-CM | POA: Diagnosis not present

## 2021-04-20 ENCOUNTER — Other Ambulatory Visit: Payer: Self-pay | Admitting: Cardiology

## 2021-04-21 ENCOUNTER — Other Ambulatory Visit: Payer: Self-pay | Admitting: Cardiology

## 2021-04-26 DIAGNOSIS — E118 Type 2 diabetes mellitus with unspecified complications: Secondary | ICD-10-CM | POA: Diagnosis not present

## 2021-04-26 DIAGNOSIS — Z794 Long term (current) use of insulin: Secondary | ICD-10-CM | POA: Diagnosis not present

## 2021-05-06 DIAGNOSIS — J40 Bronchitis, not specified as acute or chronic: Secondary | ICD-10-CM | POA: Diagnosis not present

## 2021-05-13 DIAGNOSIS — J9600 Acute respiratory failure, unspecified whether with hypoxia or hypercapnia: Secondary | ICD-10-CM | POA: Diagnosis not present

## 2021-05-26 DIAGNOSIS — Z794 Long term (current) use of insulin: Secondary | ICD-10-CM | POA: Diagnosis not present

## 2021-05-26 DIAGNOSIS — E118 Type 2 diabetes mellitus with unspecified complications: Secondary | ICD-10-CM | POA: Diagnosis not present

## 2021-06-06 DIAGNOSIS — J40 Bronchitis, not specified as acute or chronic: Secondary | ICD-10-CM | POA: Diagnosis not present

## 2021-06-13 DIAGNOSIS — J9600 Acute respiratory failure, unspecified whether with hypoxia or hypercapnia: Secondary | ICD-10-CM | POA: Diagnosis not present

## 2021-06-25 DIAGNOSIS — Z794 Long term (current) use of insulin: Secondary | ICD-10-CM | POA: Diagnosis not present

## 2021-06-25 DIAGNOSIS — E118 Type 2 diabetes mellitus with unspecified complications: Secondary | ICD-10-CM | POA: Diagnosis not present

## 2021-07-06 DIAGNOSIS — J40 Bronchitis, not specified as acute or chronic: Secondary | ICD-10-CM | POA: Diagnosis not present

## 2021-07-13 DIAGNOSIS — J9600 Acute respiratory failure, unspecified whether with hypoxia or hypercapnia: Secondary | ICD-10-CM | POA: Diagnosis not present

## 2021-08-06 DIAGNOSIS — J40 Bronchitis, not specified as acute or chronic: Secondary | ICD-10-CM | POA: Diagnosis not present

## 2021-08-13 DIAGNOSIS — J9600 Acute respiratory failure, unspecified whether with hypoxia or hypercapnia: Secondary | ICD-10-CM | POA: Diagnosis not present

## 2021-08-23 DIAGNOSIS — N182 Chronic kidney disease, stage 2 (mild): Secondary | ICD-10-CM | POA: Diagnosis not present

## 2021-08-23 DIAGNOSIS — E1122 Type 2 diabetes mellitus with diabetic chronic kidney disease: Secondary | ICD-10-CM | POA: Diagnosis not present

## 2021-08-23 DIAGNOSIS — Z9641 Presence of insulin pump (external) (internal): Secondary | ICD-10-CM | POA: Diagnosis not present

## 2021-08-23 DIAGNOSIS — I129 Hypertensive chronic kidney disease with stage 1 through stage 4 chronic kidney disease, or unspecified chronic kidney disease: Secondary | ICD-10-CM | POA: Diagnosis not present

## 2021-08-23 DIAGNOSIS — E039 Hypothyroidism, unspecified: Secondary | ICD-10-CM | POA: Diagnosis not present

## 2021-08-23 DIAGNOSIS — E785 Hyperlipidemia, unspecified: Secondary | ICD-10-CM | POA: Diagnosis not present

## 2021-08-23 DIAGNOSIS — E1165 Type 2 diabetes mellitus with hyperglycemia: Secondary | ICD-10-CM | POA: Diagnosis not present

## 2021-09-06 DIAGNOSIS — J40 Bronchitis, not specified as acute or chronic: Secondary | ICD-10-CM | POA: Diagnosis not present

## 2021-09-12 ENCOUNTER — Other Ambulatory Visit: Payer: Self-pay | Admitting: Gastroenterology

## 2021-09-12 DIAGNOSIS — K5792 Diverticulitis of intestine, part unspecified, without perforation or abscess without bleeding: Secondary | ICD-10-CM

## 2021-09-12 DIAGNOSIS — R1084 Generalized abdominal pain: Secondary | ICD-10-CM | POA: Diagnosis not present

## 2021-09-12 DIAGNOSIS — K5732 Diverticulitis of large intestine without perforation or abscess without bleeding: Secondary | ICD-10-CM | POA: Diagnosis not present

## 2021-09-13 DIAGNOSIS — J9600 Acute respiratory failure, unspecified whether with hypoxia or hypercapnia: Secondary | ICD-10-CM | POA: Diagnosis not present

## 2021-09-21 ENCOUNTER — Inpatient Hospital Stay: Admission: RE | Admit: 2021-09-21 | Payer: Medicare Other | Source: Ambulatory Visit

## 2021-10-06 DIAGNOSIS — J40 Bronchitis, not specified as acute or chronic: Secondary | ICD-10-CM | POA: Diagnosis not present

## 2021-10-12 ENCOUNTER — Inpatient Hospital Stay: Admission: RE | Admit: 2021-10-12 | Payer: Medicare Other | Source: Ambulatory Visit

## 2021-10-13 DIAGNOSIS — J9600 Acute respiratory failure, unspecified whether with hypoxia or hypercapnia: Secondary | ICD-10-CM | POA: Diagnosis not present

## 2021-10-24 DIAGNOSIS — E1165 Type 2 diabetes mellitus with hyperglycemia: Secondary | ICD-10-CM | POA: Diagnosis not present

## 2021-10-24 DIAGNOSIS — I129 Hypertensive chronic kidney disease with stage 1 through stage 4 chronic kidney disease, or unspecified chronic kidney disease: Secondary | ICD-10-CM | POA: Diagnosis not present

## 2021-10-24 DIAGNOSIS — E039 Hypothyroidism, unspecified: Secondary | ICD-10-CM | POA: Diagnosis not present

## 2021-10-24 DIAGNOSIS — E785 Hyperlipidemia, unspecified: Secondary | ICD-10-CM | POA: Diagnosis not present

## 2021-10-24 DIAGNOSIS — E1122 Type 2 diabetes mellitus with diabetic chronic kidney disease: Secondary | ICD-10-CM | POA: Diagnosis not present

## 2021-10-24 DIAGNOSIS — N1831 Chronic kidney disease, stage 3a: Secondary | ICD-10-CM | POA: Diagnosis not present

## 2021-10-24 DIAGNOSIS — Z23 Encounter for immunization: Secondary | ICD-10-CM | POA: Diagnosis not present

## 2021-11-06 DIAGNOSIS — J40 Bronchitis, not specified as acute or chronic: Secondary | ICD-10-CM | POA: Diagnosis not present

## 2021-11-13 DIAGNOSIS — J9600 Acute respiratory failure, unspecified whether with hypoxia or hypercapnia: Secondary | ICD-10-CM | POA: Diagnosis not present

## 2021-12-06 DIAGNOSIS — J40 Bronchitis, not specified as acute or chronic: Secondary | ICD-10-CM | POA: Diagnosis not present

## 2022-04-13 DIAGNOSIS — E1122 Type 2 diabetes mellitus with diabetic chronic kidney disease: Secondary | ICD-10-CM | POA: Diagnosis not present

## 2022-04-13 DIAGNOSIS — E785 Hyperlipidemia, unspecified: Secondary | ICD-10-CM | POA: Diagnosis not present

## 2022-04-13 DIAGNOSIS — E039 Hypothyroidism, unspecified: Secondary | ICD-10-CM | POA: Diagnosis not present

## 2022-04-13 DIAGNOSIS — E1165 Type 2 diabetes mellitus with hyperglycemia: Secondary | ICD-10-CM | POA: Diagnosis not present

## 2022-04-13 DIAGNOSIS — I129 Hypertensive chronic kidney disease with stage 1 through stage 4 chronic kidney disease, or unspecified chronic kidney disease: Secondary | ICD-10-CM | POA: Diagnosis not present

## 2022-04-13 DIAGNOSIS — N183 Chronic kidney disease, stage 3 unspecified: Secondary | ICD-10-CM | POA: Diagnosis not present

## 2022-05-04 DIAGNOSIS — G2581 Restless legs syndrome: Secondary | ICD-10-CM | POA: Diagnosis not present

## 2022-05-04 DIAGNOSIS — Z743 Need for continuous supervision: Secondary | ICD-10-CM | POA: Diagnosis not present

## 2022-05-04 DIAGNOSIS — M25461 Effusion, right knee: Secondary | ICD-10-CM | POA: Diagnosis not present

## 2022-05-04 DIAGNOSIS — E1165 Type 2 diabetes mellitus with hyperglycemia: Secondary | ICD-10-CM | POA: Diagnosis not present

## 2022-05-04 DIAGNOSIS — M25561 Pain in right knee: Secondary | ICD-10-CM | POA: Diagnosis not present

## 2022-05-04 DIAGNOSIS — M1711 Unilateral primary osteoarthritis, right knee: Secondary | ICD-10-CM | POA: Diagnosis not present

## 2022-05-04 DIAGNOSIS — R739 Hyperglycemia, unspecified: Secondary | ICD-10-CM | POA: Diagnosis not present

## 2022-05-04 DIAGNOSIS — R6889 Other general symptoms and signs: Secondary | ICD-10-CM | POA: Diagnosis not present

## 2022-06-28 DIAGNOSIS — E039 Hypothyroidism, unspecified: Secondary | ICD-10-CM | POA: Diagnosis not present

## 2022-06-28 DIAGNOSIS — E1122 Type 2 diabetes mellitus with diabetic chronic kidney disease: Secondary | ICD-10-CM | POA: Diagnosis not present

## 2022-06-28 DIAGNOSIS — E1165 Type 2 diabetes mellitus with hyperglycemia: Secondary | ICD-10-CM | POA: Diagnosis not present

## 2022-11-08 DIAGNOSIS — Z23 Encounter for immunization: Secondary | ICD-10-CM | POA: Diagnosis not present

## 2022-11-08 DIAGNOSIS — I129 Hypertensive chronic kidney disease with stage 1 through stage 4 chronic kidney disease, or unspecified chronic kidney disease: Secondary | ICD-10-CM | POA: Diagnosis not present

## 2022-11-08 DIAGNOSIS — E1165 Type 2 diabetes mellitus with hyperglycemia: Secondary | ICD-10-CM | POA: Diagnosis not present

## 2022-11-08 DIAGNOSIS — G2581 Restless legs syndrome: Secondary | ICD-10-CM | POA: Diagnosis not present

## 2022-11-08 DIAGNOSIS — E039 Hypothyroidism, unspecified: Secondary | ICD-10-CM | POA: Diagnosis not present

## 2022-11-08 DIAGNOSIS — G309 Alzheimer's disease, unspecified: Secondary | ICD-10-CM | POA: Diagnosis not present

## 2022-11-08 DIAGNOSIS — E1122 Type 2 diabetes mellitus with diabetic chronic kidney disease: Secondary | ICD-10-CM | POA: Diagnosis not present

## 2022-11-08 DIAGNOSIS — E785 Hyperlipidemia, unspecified: Secondary | ICD-10-CM | POA: Diagnosis not present

## 2022-11-08 DIAGNOSIS — Z0000 Encounter for general adult medical examination without abnormal findings: Secondary | ICD-10-CM | POA: Diagnosis not present

## 2023-05-04 DIAGNOSIS — E538 Deficiency of other specified B group vitamins: Secondary | ICD-10-CM | POA: Diagnosis not present

## 2023-05-04 DIAGNOSIS — E559 Vitamin D deficiency, unspecified: Secondary | ICD-10-CM | POA: Diagnosis not present

## 2023-05-04 DIAGNOSIS — Z79899 Other long term (current) drug therapy: Secondary | ICD-10-CM | POA: Diagnosis not present

## 2023-05-04 DIAGNOSIS — R946 Abnormal results of thyroid function studies: Secondary | ICD-10-CM | POA: Diagnosis not present

## 2023-05-04 DIAGNOSIS — R7309 Other abnormal glucose: Secondary | ICD-10-CM | POA: Diagnosis not present

## 2023-05-04 DIAGNOSIS — Z13220 Encounter for screening for lipoid disorders: Secondary | ICD-10-CM | POA: Diagnosis not present

## 2023-05-09 DIAGNOSIS — G2581 Restless legs syndrome: Secondary | ICD-10-CM | POA: Diagnosis not present

## 2023-05-09 DIAGNOSIS — G309 Alzheimer's disease, unspecified: Secondary | ICD-10-CM | POA: Diagnosis not present

## 2023-05-09 DIAGNOSIS — E039 Hypothyroidism, unspecified: Secondary | ICD-10-CM | POA: Diagnosis not present

## 2023-05-09 DIAGNOSIS — E1122 Type 2 diabetes mellitus with diabetic chronic kidney disease: Secondary | ICD-10-CM | POA: Diagnosis not present

## 2023-05-09 DIAGNOSIS — I129 Hypertensive chronic kidney disease with stage 1 through stage 4 chronic kidney disease, or unspecified chronic kidney disease: Secondary | ICD-10-CM | POA: Diagnosis not present

## 2023-05-09 DIAGNOSIS — E785 Hyperlipidemia, unspecified: Secondary | ICD-10-CM | POA: Diagnosis not present

## 2023-05-09 DIAGNOSIS — N1831 Chronic kidney disease, stage 3a: Secondary | ICD-10-CM | POA: Diagnosis not present

## 2023-09-12 DIAGNOSIS — E039 Hypothyroidism, unspecified: Secondary | ICD-10-CM | POA: Diagnosis not present

## 2023-09-12 DIAGNOSIS — Z23 Encounter for immunization: Secondary | ICD-10-CM | POA: Diagnosis not present

## 2023-09-12 DIAGNOSIS — E1122 Type 2 diabetes mellitus with diabetic chronic kidney disease: Secondary | ICD-10-CM | POA: Diagnosis not present

## 2023-09-12 DIAGNOSIS — G309 Alzheimer's disease, unspecified: Secondary | ICD-10-CM | POA: Diagnosis not present

## 2023-09-12 DIAGNOSIS — I129 Hypertensive chronic kidney disease with stage 1 through stage 4 chronic kidney disease, or unspecified chronic kidney disease: Secondary | ICD-10-CM | POA: Diagnosis not present

## 2023-09-12 DIAGNOSIS — N1831 Chronic kidney disease, stage 3a: Secondary | ICD-10-CM | POA: Diagnosis not present

## 2023-09-15 DIAGNOSIS — R0902 Hypoxemia: Secondary | ICD-10-CM | POA: Diagnosis not present

## 2023-09-15 DIAGNOSIS — E1122 Type 2 diabetes mellitus with diabetic chronic kidney disease: Secondary | ICD-10-CM | POA: Diagnosis not present

## 2023-09-15 DIAGNOSIS — W19XXXA Unspecified fall, initial encounter: Secondary | ICD-10-CM | POA: Diagnosis not present

## 2023-09-15 DIAGNOSIS — I129 Hypertensive chronic kidney disease with stage 1 through stage 4 chronic kidney disease, or unspecified chronic kidney disease: Secondary | ICD-10-CM | POA: Diagnosis not present

## 2023-09-15 DIAGNOSIS — G928 Other toxic encephalopathy: Secondary | ICD-10-CM | POA: Diagnosis not present

## 2023-09-15 DIAGNOSIS — N390 Urinary tract infection, site not specified: Secondary | ICD-10-CM | POA: Diagnosis not present

## 2023-09-15 DIAGNOSIS — R410 Disorientation, unspecified: Secondary | ICD-10-CM | POA: Diagnosis not present

## 2023-09-15 DIAGNOSIS — S0990XA Unspecified injury of head, initial encounter: Secondary | ICD-10-CM | POA: Diagnosis not present

## 2023-09-15 DIAGNOSIS — E1169 Type 2 diabetes mellitus with other specified complication: Secondary | ICD-10-CM | POA: Diagnosis not present

## 2023-09-15 DIAGNOSIS — E162 Hypoglycemia, unspecified: Secondary | ICD-10-CM | POA: Diagnosis not present

## 2023-09-15 DIAGNOSIS — Z91148 Patient's other noncompliance with medication regimen for other reason: Secondary | ICD-10-CM | POA: Diagnosis not present

## 2023-09-15 DIAGNOSIS — R58 Hemorrhage, not elsewhere classified: Secondary | ICD-10-CM | POA: Diagnosis not present

## 2023-09-15 DIAGNOSIS — Z743 Need for continuous supervision: Secondary | ICD-10-CM | POA: Diagnosis not present

## 2023-09-15 DIAGNOSIS — E7800 Pure hypercholesterolemia, unspecified: Secondary | ICD-10-CM | POA: Diagnosis not present

## 2023-09-15 DIAGNOSIS — M542 Cervicalgia: Secondary | ICD-10-CM | POA: Diagnosis not present

## 2023-09-15 DIAGNOSIS — G8929 Other chronic pain: Secondary | ICD-10-CM | POA: Diagnosis not present

## 2023-09-15 DIAGNOSIS — N3001 Acute cystitis with hematuria: Secondary | ICD-10-CM | POA: Diagnosis not present

## 2023-09-15 DIAGNOSIS — R001 Bradycardia, unspecified: Secondary | ICD-10-CM | POA: Diagnosis not present

## 2023-09-15 DIAGNOSIS — I10 Essential (primary) hypertension: Secondary | ICD-10-CM | POA: Diagnosis not present

## 2023-09-15 DIAGNOSIS — Z794 Long term (current) use of insulin: Secondary | ICD-10-CM | POA: Diagnosis not present

## 2023-09-15 DIAGNOSIS — E11649 Type 2 diabetes mellitus with hypoglycemia without coma: Secondary | ICD-10-CM | POA: Diagnosis not present

## 2023-09-15 DIAGNOSIS — E785 Hyperlipidemia, unspecified: Secondary | ICD-10-CM | POA: Diagnosis not present

## 2023-09-15 DIAGNOSIS — G2581 Restless legs syndrome: Secondary | ICD-10-CM | POA: Diagnosis not present

## 2023-09-15 DIAGNOSIS — E039 Hypothyroidism, unspecified: Secondary | ICD-10-CM | POA: Diagnosis not present

## 2023-09-15 DIAGNOSIS — T68XXXA Hypothermia, initial encounter: Secondary | ICD-10-CM | POA: Diagnosis not present

## 2023-09-15 DIAGNOSIS — Z79899 Other long term (current) drug therapy: Secondary | ICD-10-CM | POA: Diagnosis not present

## 2023-09-15 DIAGNOSIS — N189 Chronic kidney disease, unspecified: Secondary | ICD-10-CM | POA: Diagnosis not present

## 2023-09-15 DIAGNOSIS — I491 Atrial premature depolarization: Secondary | ICD-10-CM | POA: Diagnosis not present

## 2023-09-15 DIAGNOSIS — J449 Chronic obstructive pulmonary disease, unspecified: Secondary | ICD-10-CM | POA: Diagnosis not present

## 2023-09-15 DIAGNOSIS — E876 Hypokalemia: Secondary | ICD-10-CM | POA: Diagnosis not present

## 2023-09-15 DIAGNOSIS — S91312A Laceration without foreign body, left foot, initial encounter: Secondary | ICD-10-CM | POA: Diagnosis not present

## 2023-09-15 NOTE — H&P (Signed)
 ------------------------------------------------------------------------------- Attestation signed by [name] MARLA Fairly, MD at 09/16/2023  8:08 AM I have reviewed the note and agree with the plan as documented by APP.  I have not seen the patient.    [name] Assistant Professor of Medicine Diplomat of American Board of Internal Medicine [facility] Department of Internal Medicine, Section of Hospital Medicine 09/16/2023 8:08 AM    -------------------------------------------------------------------------------  Hospitalist Admission History and Physical    Chief Complaint  AMS, hypoglycemia   HPI  [name] a 83 y.o. year old female with a PMH of dementia, CKD, DM, GERD, HLD, hypertension, thyroid  disease presented to ED after being found on the floor by her husband with altered mental status.  Patient seen and examined resting comfortably in stretcher.  [name] alert and oriented to self, place and time but not situation.  [name] unable to recall events leading up to hospital arrival this morning.  Patient denies any acute complaints at this time, asking me why she [name] here.  Denies chest pain, shortness of breath, abdominal pain, nausea, vomiting, changes in bowel movements, urinary symptoms. Unable to recall any medical history or current medications.  Unable to obtain further history from patient.  Discussed patient's care with patient's daughter over the phone.  Patient's daughter states that the patient has dementia and lives with her husband who also has dementia.  They have a caregiver during the day.  Patient was reportedly found by her husband on the floor after rolling off from the bed.  Patient's daughter denies any history of hypoglycemia or any recent changes in insulin dosing.  Patient's daughter states patient has been otherwise fine, no recent complaints or changes in oral intake.  [name] usually able to ambulate without  any assistance at home.  Vitals on arrival include temp 92.4, HR 59, RR 20, BP 145/66, 99% on room air.  Bair hugger was applied to patient, most recent temp on admission [name] 95.2. ED workup showed hemoglobin 12.1, platelet count 129, potassium 3.3, total bilirubin 0.2, troponin 34-33, UA with 250 leukocyte esterase, 6-12 WBC, many bacteria.  Chest x-ray with no acute cardiac or pulmonary abnormality.  CT cervical spine negative for acute fracture or traumatic malalignment.  CT head with no acute intracranial abnormality.  EKG with sinus bradycardia, PVCs.  Given Cipro  for UTI and dextrose 15 g oral gel for glucose of 55 in ED.   Assessment and Plan  [name] a 83 y.o. year old female with a PMH of dementia, CKD, DM, GERD, HLD, hypertension, thyroid  disease presented to ED after being found on the floor by her husband with altered mental status. Patient was found to be minimally responsive, hypoglycemic, hypothermic.    Patient was admitted to hospital medicine service for further management.    Principal Problem:   Altered mental status Active Problems:   Hypoglycemia   Type 2 diabetes mellitus, with long-term current use of insulin    (CMD)   Hypokalemia   HTN (hypertension)   HLD (hyperlipidemia)   Hypothyroidism Resolved Problems:   * No resolved hospital problems. *  Altered Mental Status Hypoglycemia  Hx of T2DM Presented to ED after being found on the floor by her husband with altered mental status.  Patient was found to be minimally responsive, hypoglycemic, hypothermic.  Per daughter, patient takes glipizide 10 mg, metformin 500 mg daily, Humulin 70/30 25 units 3 times daily depending on her glucose levels. -  Mentation improving with improvement in hypothermia and hypoglycemia.  Continue to monitor - Will hold off on restarting home insulin. SSI for now - Consistent carb diet - Hypoglycemia protocol - Wound care for skin tears from fall at home   Hypokalemia -  Monitor and replace as needed   Possible UTI UA with 250 leukocyte esterase, 6-12 WBC, many bacteria. S/p single dose of Cipro  in ED. - Will utilize IV ceftriaxone, de-escalate as appropriate - Follow urine culture  Chronic Medical Conditions: HLD HTN Dementia Hypothyroidism - Continue home medications as appropriate for IP setting   Diet: Adult Diet- Consistent Carbohydrate; High (60-75 gm/meal HS snack)   Code Status: Full Code   DVT Prophylaxis: SQ Enoxaparin  Anticipated disposition [name] to Other: TBD in 2-3 days.     History  Past Medical and Surgical History, Family History, Social History Medical History[1] Surgical History[2] Family History[3] Social History   Socioeconomic History  . Marital status: Married    Spouse name: Not on file  . Number of children: Not on file  . Years of education: Not on file  . Highest education level: Not on file  Occupational History  . Not on file  Tobacco Use  . Smoking status: Former  . Smokeless tobacco: Never  Substance and Sexual Activity  . Alcohol use: Never  . Drug use: Never  . Sexual activity: Not on file  Other Topics Concern  . Not on file  Social History Narrative  . Not on file   Social Drivers of Health   Food Insecurity: Low Risk  (09/15/2023)   Food vital sign   . Within the past 12 months, you worried that your food would run out before you got money to buy more: Never true   . Within the past 12 months, the food you bought just didn't last and you didn't have money to get more: Never true  Transportation Needs: No Transportation Needs (09/15/2023)   Transportation   . In the past 12 months, has lack of reliable transportation kept you from medical appointments, meetings, work or from getting things needed for daily living? : No  Safety: Not on file  Living Situation: Low Risk  (09/15/2023)   Living Situation   . What [name] your living situation today?: I have a steady place to live   . Think about the  place you live. Do you have problems with any of the following? Choose all that apply:: None/None on this list      Allergies  Allergies[4]   Home Medications  Home Medications           * amLODIPine (NORVASC) 5 mg tablet   * aspirin 81 mg EC tablet   * gabapentin (NEURONTIN) 600 mg tablet   * glimepiride (AMARYL) 4 mg tablet   * insulin lispro (HumaLOG) 100 unit/mL injection   * levoFLOXacin (LEVAQUIN) 750 mg tablet    Take 750 mg by mouth every other day. Indications: hospital-acquired pneumonia due to Streptococcus pneumoniae   * levothyroxine (SYNTHROID) 100 mcg tablet   * metFORMIN (GLUCOPHAGE-XR) 500 mg 24 hr tablet   * pramipexole (MIRAPEX) 0.125 mg tablet   * rosuvastatin (CRESTOR) 20 mg tablet      Flag for Review           * albuterol  HFA (PROVENTIL  HFA;VENTOLIN  HFA;PROAIR  HFA) 90 mcg/actuation inhaler (Expired)    Inhale 2 puffs every 6 (six) hours as needed.   * ipratropium-albuteroL  (DUO-NEB) 0.5-2.5 mg/3 mL nebulizer  solution   * multivitamin with minerals (Daily Multivitamin-Minerals) tab   * Omnipod Dash Pods, Gen 4, crtg   * SITagliptin-metFORMIN (Janumet) 50-1,000 mg per tablet        Review of Systems  Pertinent items are noted in HPI above.     Physical Exam  Temp:  [92.4 F (33.6 C)-95.2 F (35.1 C)] 95.2 F (35.1 C) Heart Rate:  [56-76] 76 Resp:  [15-27] 27 BP: (131-156)/(56-78) 131/57 Body mass index [name] 40.19 kg/m.  Physical Exam Constitutional:      General: She [name] not in acute distress. HENT:     Head: Normocephalic.  Eyes:     Extraocular Movements: Extraocular movements intact.  Cardiovascular:     Rate and Rhythm: Normal rate and regular rhythm.     Heart sounds: Normal heart sounds.  Pulmonary:     Effort: Pulmonary effort [name] normal.     Breath sounds: Normal breath sounds.  Abdominal:     Palpations: Abdomen [name] soft.     Tenderness: There [name] no abdominal tenderness. There [name] no guarding.  Musculoskeletal:      Comments: See media tab for photo of left foot.   Skin:    General: Skin [name] warm and dry.  Neurological:     Mental Status: She [name] alert.     Comments: Oriented to self, place, time but not situation.  Unable to recall why she [name] in the hospital or the fall from earlier this morning.  Unable to recall her medical history or her medications which [name] baseline per patient's daughter.  Psychiatric:        Mood and Affect: Mood normal.        Behavior: Behavior normal.      Labs and Results  I have reviewed the following labs and results: Labs: Lab Results  Component Value Date   WBC 7.07 09/15/2023   HGB 12.1 (L) 09/15/2023   HCT 36.1 09/15/2023   MCV 91.1 09/15/2023   PLT 129 (L) 09/15/2023   Lab Results  Component Value Date   GLUCOSE 99 09/15/2023   CALCIUM 8.8 09/15/2023   NA 139 09/15/2023   K 3.3 (L) 09/15/2023   CO2 24 09/15/2023   CL 105 09/15/2023   BUN 19 09/15/2023   CREATININE 0.83 09/15/2023   Lab Results  Component Value Date   ALT 9 09/15/2023   AST 13 09/15/2023   BILITOT 0.2 (L) 09/15/2023   No results found for: INR, PROTIME  Micro: No results found for this visit on 09/15/23 (from the past 48 hours).  Radiology: XR Chest 1 View  Preliminary Result by Lorrene Dallas Molt, MD [phone])  XR CHEST 1 VIEW 09/15/2023 8:50 AM    INDICATION: ams/hypoglycemia   COMPARISON: CT thorax 03/15/2021    FINDINGS:    .  Supportive devices: No internal support apparatus imaged.  .  Cardiovascular/Mediastinum: The cardiomediastinal silhouette [name] within   normal limits. Thoracic aortic calcifications.  .  Lungs: No focal consolidation or pulmonary edema.  .  Pleura: No pleural effusion. No pneumothorax.  SABRA  Upper abdomen: Unremarkable.  .  Osseous Structures: No acute displaced fractures. Polyarticular   degenerative changes.  .  Soft tissues: Unremarkable.    IMPRESSION:    No radiographic evidence of acute cardiac or pulmonary abnormality.    CT  Head WO Contrast  Preliminary Result by Lorrene Dallas Molt, MD [phone])  CT HEAD WITHOUT CONTRAST, 09/15/2023 8:48 AM    INDICATION: Neuro  deficit, acute, stroke suspected     COMPARISON: MRI brain 11/19/2020 and CT head 11/15/2020    TECHNIQUE: Axial CT images of the brain from skull base to vertex,   including portions of the face and sinuses, were obtained without   contrast. Supplemental 2D reformatted images were generated and reviewed   as needed.    All CT scans at [facility] and [facility]   Imaging are performed using radiation dose optimization techniques as   appropriate to a performed exam, including but not limited to one or more   of the following: automatic exposure control, adjustment of the mA and/or   kV according to patient size, use of iterative reconstruction technique.   In addition, our institution participates in a radiation dose monitoring   program to optimize patient radiation exposure.    FINDINGS:  Calvarium/skull base: No evidence of acute fracture or destructive lesion.   Mastoids and middle ears demonstrate no substantial mucosal disease.    Paranasal sinuses: No air fluid levels.    Brain: No acute large vascular territory infarct. No mass effect. No   hydrocephalus. No acute hemorrhage. Patchy areas of low density in the   periventricular and subcortical white matter likely represent changes of   chronic small vessel disease. Diffuse cerebral volume loss with ex vacuo   expansion of the ventricles. Intracranial calcified atherosclerosis.   Well-defined small hypodensity within the right cerebral hemisphere,   favored to represent remote infarct however appears new since 2022.   Partially empty sella.    Additional findings: Hypertrophy of the bilateral extraocular muscles,   which can be seen in the setting of Graves' ophthalmopathy.    IMPRESSION:  No acute intracranial abnormality. However, CT [name] relatively  insensitive   for the detection of acute infarct within the first 24-48 hours, and MRI   may be indicated if there [name] high clinical suspicion.    CT Spine Cervical WO Contrast  Preliminary Result by Lorrene Dallas Molt, MD [phone])  CT CERVICAL SPINE WITHOUT CONTRAST, 09/15/2023 8:48 AM    INDICATION: fall     COMPARISON: CT cervical spine 11/15/2020    TECHNIQUE: Thin-section axial CT images of the entire cervical spine were   acquired without contrast. Supplemental 2D reformatted images were   generated and reviewed as needed.    All CT scans at [facility] and [facility]   Imaging are performed using radiation dose optimization techniques as   appropriate to a performed exam, including but not limited to one or more   of the following: automatic exposure control, adjustment of the mA and/or   kV according to patient size, use of iterative reconstruction technique.   In addition, our institution participates in a radiation dose monitoring   program to optimize patient radiation exposure.    LEVELS IMAGED: Foramen magnum to upper thoracic region.    FINDINGS:  Alignment: No acute traumatic malalignment. Slight stepwise   anterolisthesis of C3 on C4, C4-C5, and C5 on C6, favored degenerative.   Straightening of the normal cervical lordosis.    Craniocervical junction: No evidence of acute fracture or dislocation.    Vertebrae: No acute fractures. Vertebral body heights maintained.    Degenerative changes: No high-grade canal stenosis. Moderate endplate   spurring of C6-C7. Multilevel facet and uncovertebral joint hypertrophy   resulting in varying degrees of multilevel neural foraminal stenosis.    IMPRESSION:  No evidence of acute  fracture or traumatic malalignment of the cervical   spine.      EKG: Encounter Date: 09/15/23  ECG 12 lead   Narrative   Ventricular Rate                   57        BPM                  Atrial Rate                         57        BPM                  P-R Interval                       180       ms                   QRS Duration                       76        ms                   Q-T Interval                       486       ms                   QTC Calculation Bazett             473       ms                   Calculated P Axis                  67        degrees              Calculated R Axis                  25        degrees              Calculated T Axis                  38        degrees               Sinus bradycardia with Premature supraventricular complexes Otherwise normal ECG When compared with ECG of 19-Nov-2020 10 09, Premature supraventricular complexes are now present QT has lengthened Confirmed by Rojelio Dunnings  [phone]  on 09-15-2023 8 14 56 AM      Electronically signed by: Yolanda LULLA Fairly, PA-C 09/15/2023 10:23 AM        [1] Past Medical History: Diagnosis Date  . Chronic kidney disease   . Diabetes mellitus    (CMD)   . GERD (gastroesophageal reflux disease)   . Hypercholesterolemia   . Hypertension   . Thyroid  disease   [2] Past Surgical History: Procedure Laterality Date  . APPENDECTOMY     Procedure: APPENDECTOMY  . EYE SURGERY     Procedure: EYE SURGERY  . HYSTERECTOMY      Procedure: HYSTERECTOMY  [3] Family History Problem Relation Name Age of Onset  . Lung cancer Mother    .  Lung cancer Father    [4] Allergies Allergen Reactions  . Penicillins Hives, Itching and Other (See Comments)    hives  . Sulfa (Sulfonamide Antibiotics) Hives  . Penicillin Itching  . Aspirin Rash  *Some images could not be shown.

## 2023-09-15 NOTE — ED Provider Notes (Signed)
 [facility] Emergency Department Emergency Department Provider Note  Provider at Bedside:  09/15/2023 7:40 AM  Chief Complaint: Hypoglycemia  History of Present Illness:  History obtained from: Patient and EMS  [name] is a 83 y.o. female with PMHx of COPD who presents to the ED with complaints of hypoglycemia. Per EMS, patient was found on the floor this morning minimally responsive with a cut on the left foot. Per EMS, patient's initial CBG was 41 and she was given D10 prior to arrival. Patient is unable to recall details of the fall. She denies any complaints at this time.   ______________________ ROS: Pertinent positives and negatives per HPI. Pertinent past medical, surgical, social and family history records were reviewed. Current Medications and Allergies were reviewed.  Physical Exam   Vitals:   09/15/23 0900 09/15/23 0938 09/15/23 1000 09/15/23 1100  BP: 136/78  (!) 131/57 128/65  BP Location:      Patient Position:      Pulse: 63  76 82  Resp: 17  (!) 27 (!) 21  Temp:  95.2 F (35.1 C)    TempSrc:  Rectal    SpO2:      Height:          Physical Exam Vitals and nursing note reviewed.  Constitutional:      General: She is not in acute distress.    Appearance: Normal appearance.  HENT:     Head: Normocephalic and atraumatic.     Right Ear: External ear normal.     Left Ear: External ear normal.     Nose: Nose normal.     Mouth/Throat:     Mouth: Mucous membranes are moist.  Eyes:     Pupils: Pupils are equal, round, and reactive to light.  Cardiovascular:     Rate and Rhythm: Normal rate and regular rhythm.     Heart sounds: Normal heart sounds.  Pulmonary:     Effort: Pulmonary effort is normal.     Breath sounds: Normal breath sounds.  Abdominal:     Palpations: Abdomen is soft.     Tenderness: There is no abdominal tenderness.  Musculoskeletal:        General: Normal range of motion.     Cervical back: Normal range of  motion.  Feet:     Comments: 3 CM V shaped laceration on the left lateral mid foot.  Skin:    General: Skin is warm.  Neurological:     General: No focal deficit present.     Mental Status: She is alert and oriented to person, place, and time.  Psychiatric:        Mood and Affect: Mood normal.        Behavior: Behavior normal.        Thought Content: Thought content normal.        Results   EKG Impression:  My Interpretation: EKG For acute MI, arrhythmia, conduction abnormality, ischemia, electrolyte abnormality was performed and showed EKG time: 0543 Interpretation time: 11:29 AM Rate: 57 Rhythm: sinus bradycardia and premature atrial contractions (PAC) Axis: Normal Intervals: Poor R wave progression ST-T Waves: Normal ST-T Waves Comparison with Old:  Yes -no significant changes compared to November 19, 2020 except for the rate  Labs: Lab Results (last 24 hours)     Procedure Component Value Ref Range Date/Time   POC Glucose [[id number]]  (Normal) Collected: 09/15/23 0935   Lab Status: Final result Specimen: Blood from Capillary Updated: 09/15/23 0936  Glucose, POC 89 70 - 99 mg/dL     Comment: Post-Meal     POC Glucose [[id number]]  (Abnormal) Collected: 09/15/23 0850   Lab Status: Final result Specimen: Blood from Capillary Updated: 09/15/23 0851    Glucose, POC 55* 70 - 99 mg/dL     Comment: Recheck/confirm     Urinalysis with Reflex to Microscopic [[id number]]  (Abnormal) Collected: 09/15/23 0846   Lab Status: Final result Specimen: Urine from Clean Catch Updated: 09/15/23 0855    Color, Urine Yellow Yellow     Clarity, Urine Clear Clear     Specific Gravity, Urine 1.009 1.005 - 1.025     pH, Urine 5.5 5.0 - 8.0     Protein, Urine 20 Negative, 10 , 20  mg/dL     Glucose, Urine Negative Negative, 30 , 50  mg/dL     Ketones, Urine Negative Negative, Trace mg/dL     Bilirubin, Urine Negative Negative     Blood, Urine Negative Negative, Trace     Nitrite,  Urine Negative Negative     Leukocyte Esterase, Urine 250* Negative, 25     Urobilinogen, Urine Normal <2.0 mg/dL     WBC, Urine 3-87* <6 /HPF     RBC, Urine 0-2 0 - 2 /HPF     Bacteria, Urine Many* None Seen, Rare /HPF     Squamous Epithelial Cells, Urine 0-5 0 - 5 /HPF    Urine Culture [[id number]] Collected: 09/15/23 0846   Lab Status: In process Specimen: Urine from Clean Catch Updated: 09/15/23 1052   Troponin, High Sensitive (2 Hr Rfx) [[id number]]  (Abnormal) Collected: 09/15/23 0835   Lab Status: Final result Specimen: Blood from Venous Updated: 09/15/23 0926    Troponin, High Sensitive 33* <15 ng/L     Comment: This result is a repeat critical value within 12 hours. Previous critical high sensitive troponin result was communicated to appropriate health care provider.  >= 15 ng/L INDICATES MYOCARDIAL DAMAGE.THE DIAGNOSIS OF MYOCARDIAL INFARCTION REQUIRES CLINICAL CORRELATION.   Elevated troponin may also be due to myocardial stress from a variety of causes.   Alkaline Phos (ALP) levels >400 U/L may cause falsely elevated results. Troponin test is invalid in patients taking asfotase alpha.   This troponin assay was not validated for evaluation of troponin in patients younger than 21 years. There are no ranges established for patients younger than 21 years. Therefore, laboratory results for these patients should be  interpreted with caution.       POC Glucose [[id number]]  (Normal) Collected: 09/15/23 0736   Lab Status: Final result Specimen: Blood from Capillary Updated: 09/15/23 0737    Glucose, POC 75 70 - 99 mg/dL     Comment: Recheck/confirm     POC Glucose [[id number]]  (Normal) Collected: 09/15/23 0627   Lab Status: Final result Specimen: Blood from Capillary Updated: 09/15/23 0628    Glucose, POC 94 70 - 99 mg/dL     Comment: To notify MD/RN     CBC with Differential [[id number]]  (Abnormal) Collected: 09/15/23 0545   Lab Status: Final result Specimen: Blood from Venous  Updated: 09/15/23 0602   Narrative:     The following orders were created for panel order CBC with Differential. Procedure                               Abnormality         Status                    ---------                               -----------         ------  CBC with Differential[[id number]]        Abnormal            Final result               Please view results for these tests on the individual orders.   Comprehensive Metabolic Panel [[id number]]  (Abnormal) Collected: 09/15/23 0545   Lab Status: Final result Specimen: Blood from Venous Updated: 09/15/23 0627    Sodium 139 136 - 145 mmol/L     Potassium 3.3* 3.4 - 4.5 mmol/L     Chloride 105 98 - 107 mmol/L     CO2 24 21 - 31 mmol/L     Anion Gap 10 6 - 14 mmol/L     Glucose, Random 99 70 - 99 mg/dL     Blood Urea Nitrogen (BUN) 19 7 - 25 mg/dL     Creatinine 9.16 9.39 - 1.20 mg/dL     eGFR 69 >40 fO/fpw/8.26f7     Comment: GFR estimated by CKD-EPI equations(NKF 2021).   Recommend confirmation of Cr-based eGFR by using Cys-based eGFR and other filtration markers (if applicable) in complex cases and clinical decision-making, as needed.      Albumin 4.5 3.5 - 5.7 g/dL     Total Protein 7.5 6.4 - 8.9 g/dL     Bilirubin, Total 0.2* 0.3 - 1.0 mg/dL     Alkaline Phosphatase (ALP) 35 34 - 104 U/L     Aspartate Aminotransferase (AST) 13 13 - 39 U/L     Alanine Aminotransferase (ALT) 9 7 - 52 U/L     Calcium 8.8 8.6 - 10.3 mg/dL     BUN/Creatinine Ratio --    Comment: Creatinine is normal, ratio is not clinically indicated.      CBC with Differential [[id number]]  (Abnormal) Collected: 09/15/23 0545   Lab Status: Final result Specimen: Blood from Venous Updated: 09/15/23 0602    WBC 7.07 4.40 - 11.00 10*3/uL     RBC 3.96* 4.10 - 5.10 10*6/uL     Hemoglobin 12.1* 12.3 - 15.3 g/dL     Hematocrit 63.8 64.0 - 44.6 %     Mean Corpuscular Volume (MCV) 91.1 80.0 - 96.0 fL     Mean Corpuscular Hemoglobin (MCH) 30.5  27.5 - 33.2 pg     Mean Corpuscular Hemoglobin Conc (MCHC) 33.5 33.0 - 37.0 g/dL     Red Cell Distribution Width (RDW) 13.9 12.3 - 17.0 %     Platelet Count (PLT) 129* 150 - 450 10*3/uL     Mean Platelet Volume (MPV) 10.1 6.8 - 10.2 fL     Neutrophils % 29 %     Lymphocytes % 33 %     Monocytes % 37 %     Eosinophils % 1 %     Basophils % 1 %     Neutrophils Absolute 2.00 1.80 - 7.80 10*3/uL     Lymphocytes # 2.30 1.00 - 4.80 10*3/uL     Monocytes # 2.60* 0.00 - 0.80 10*3/uL     Eosinophils # 0.00 0.00 - 0.50 10*3/uL     Basophils # 0.00 0.00 - 0.20 10*3/uL    Troponin, High Sensitive (0 Hr + 2 Hr Rfx) [[id number]]  (Abnormal) Collected: 09/15/23 0545   Lab Status: Final result Specimen: Blood from Venous Updated: 09/15/23 0854    Troponin, High Sensitive 34* <15 ng/L     Comment: >= 15 ng/L INDICATES MYOCARDIAL DAMAGE.THE DIAGNOSIS OF MYOCARDIAL INFARCTION REQUIRES CLINICAL CORRELATION.   Elevated  troponin may also be due to myocardial stress from a variety of causes.   Alkaline Phos (ALP) levels >400 U/L may cause falsely elevated results. Troponin test is invalid in patients taking asfotase alpha.   This troponin assay was not validated for evaluation of troponin in patients younger than 21 years. There are no ranges established for patients younger than 21 years. Therefore, laboratory results for these patients should be  interpreted with caution.       POC Glucose [[id number]]  (Abnormal) Collected: 09/15/23 0519   Lab Status: Final result Specimen: Blood from Capillary Updated: 09/15/23 0521    Glucose, POC 121* 70 - 99 mg/dL     Comment: Recheck/confirm         My initial interpretation of patient's labs shows that the CBC and CMP were essentially normal except for mild anemia.  Patient's initial delta troponins were 34 and 33 respectively and urinalysis showed 250 leukocytes with 6-12 WBCs and many bacteria.  The patient probably has a UTI as well.  CXR  Impression: (Interpreted by me) My Interpretation: Chest xray for pneumonia, pneumothorax, pleural effusion, pulmonary edema, cardiomegaly, aortic abnormality was performed and did not show any acute cardiopulmonary findings.  Impression of additional imaging studies include: CT of the head and CT of the cervical spine were performed and were negative for any traumatic injuries or acute reasons for her symptoms.  Imaging: Radiology Results (last 72 hours)     Procedure Component Value Units Date/Time   XR Chest 1 View - Preliminary [[id number]] Collected: 09/15/23 0857   Order Status: Completed Updated: 09/15/23 0859   This result has not been signed. Information might be incomplete.     Narrative:     XR CHEST 1 VIEW 09/15/2023 8:50 AM  INDICATION: ams/hypoglycemia  COMPARISON: CT thorax 03/15/2021  FINDINGS:  .  Supportive devices: No internal support apparatus imaged. .  Cardiovascular/Mediastinum: The cardiomediastinal silhouette is within normal limits. Thoracic aortic calcifications. .  Lungs: No focal consolidation or pulmonary edema. .  Pleura: No pleural effusion. No pneumothorax. SABRA  Upper abdomen: Unremarkable. .  Osseous Structures: No acute displaced fractures. Polyarticular degenerative changes. .  Soft tissues: Unremarkable.    Impression:      No radiographic evidence of acute cardiac or pulmonary abnormality.   CT Head WO Contrast - Preliminary [[id number]] Collected: 09/15/23 0851   Order Status: Completed Updated: 09/15/23 0857   This result has not been signed. Information might be incomplete.     Narrative:     CT HEAD WITHOUT CONTRAST, 09/15/2023 8:48 AM  INDICATION: Neuro deficit, acute, stroke suspected   COMPARISON: MRI brain 11/19/2020 and CT head 11/15/2020  TECHNIQUE: Axial CT images of the brain from skull base to vertex, including portions of the face and sinuses, were obtained without contrast. Supplemental 2D reformatted images were generated and  reviewed as needed.  All CT scans at [facility] and [facility] are performed using radiation dose optimization techniques as appropriate to a performed exam, including but not limited to one or more of the following: automatic exposure control, adjustment of the mA and/or kV according to patient size, use of iterative reconstruction technique. In addition, our institution participates in a radiation dose monitoring program to optimize patient radiation exposure.  FINDINGS: Calvarium/skull base: No evidence of acute fracture or destructive lesion. Mastoids and middle ears demonstrate no substantial mucosal disease.  Paranasal sinuses: No air fluid levels.  Brain: No acute large vascular territory  infarct. No mass effect. No hydrocephalus. No acute hemorrhage. Patchy areas of low density in the periventricular and subcortical white matter likely represent changes of chronic small vessel disease. Diffuse cerebral volume loss with ex vacuo expansion of the ventricles. Intracranial calcified atherosclerosis. Well-defined small hypodensity within the right cerebral hemisphere, favored to represent remote infarct however appears new since 2022. Partially empty sella.  Additional findings: Hypertrophy of the bilateral extraocular muscles, which can be seen in the setting of Graves' ophthalmopathy.    Impression:     No acute intracranial abnormality. However, CT is relatively insensitive for the detection of acute infarct within the first 24-48 hours, and MRI may be indicated if there is high clinical suspicion.   CT Spine Cervical WO Contrast [[id number]] Collected: 09/15/23 0935   Order Status: Completed Updated: 09/15/23 1107   Narrative:     CT CERVICAL SPINE WITHOUT CONTRAST, 09/15/2023 8:48 AM  INDICATION: fall   COMPARISON: CT cervical spine 11/15/2020  TECHNIQUE: Thin-section axial CT images of the entire cervical spine were acquired without contrast.  Supplemental 2D reformatted images were generated and reviewed as needed.  All CT scans at [facility] and [facility] are performed using radiation dose optimization techniques as appropriate to a performed exam, including but not limited to one or more of the following: automatic exposure control, adjustment of the mA and/or kV according to patient size, use of iterative reconstruction technique. In addition, our institution participates in a radiation dose monitoring program to optimize patient radiation exposure.  LEVELS IMAGED: Foramen magnum to upper thoracic region.  FINDINGS: Alignment: No acute traumatic malalignment. Slight stepwise anterolisthesis of C3 on C4, C4-C5, and C5 on C6, favored degenerative. Straightening of the normal cervical lordosis.  Craniocervical junction: No evidence of acute fracture or dislocation.  Vertebrae: No acute fractures. Vertebral body heights maintained.  Degenerative changes: Moderately severe C6-C7 spondylosis contributes to moderate left greater than right neural foraminal stenosis and spinal canal stenosis at this level. Multilevel otherwise mild to moderate cervical spondylosis not resulting in significant osseous spinal canal and neural foraminal compromise.     Impression:      IMPRESSION: No evidence of acute fracture or traumatic malalignment of the cervical spine.        Procedures   Laceration repair  Date/Time: 09/15/2023 5:11 AM  Performed by: Rock Dannielle Birmingham, MD Authorized by: Rock Dannielle Birmingham, MD   Consent:    Consent obtained:  Verbal   Risks discussed:  Pain and infection Universal protocol:    Patient identity confirmed:  Verbally with patient, arm band and hospital-assigned identification number Anesthesia:    Anesthesia method:  Local infiltration   Local anesthetic:  Lidocaine 1% WITH epi Laceration details:    Location:  Foot   Foot location:  Top of L foot   Length  (cm):  3 Treatment:    Area cleansed with:  Povidone-iodine   Amount of cleaning:  Standard   Irrigation solution:  Sterile saline Skin repair:    Repair method:  Sutures   Suture size:  4-0   Suture technique:  Simple interrupted   Number of sutures:  7 Repair type:    Repair type:  Simple Post-procedure details:    Procedure completion:  Tolerated   Evidence Based Calculators      ED Course   ED Course as of 09/15/23 1129  Sat Sep 15, 2023  0816 Patient's initial vital signs showed that her temp was 92.4 [  LT]  [id number] Intervention-patient was brought in after being found on the floor and she was hypoglycemic with a glucose of 41.  She also had a laceration to her left foot.  Patient cannot recall her events or what happened.  She on arrival to the ER was placed on a Bair hugger for her hypothermia and she had been given an injection of dextrose.  She was more alert. [LT]  9182 My interpretation: Observation patient's bedside cardiac monitor showed sinus bradycardia but no acute obvious ST or T wave changes. [LT]  [id number] Intervention-patient will get routine labs, cardiac enzymes, EKG, chest x-ray, CT head to further evaluate possible syncope, and we will monitor her glucose.  Patient was also placed on a Bair hugger for her hypothermia.  Patient will get CT of her head and cervical spine as well because she was found on the floor. [LT]  1016 Patient CT of the head chest and neck were all negative for any acute findings.  Patient's temperature continued to improve with her Lawyer.  Patient was also found to have UTI as well.  Patient is going to need to be admitted to the hospitalist service for her hypoglycemic and hypothermic episode and treated for her UTI as well.  We went ahead and sent a urine culture and started her on some IV cipro  for UTI.  Hospitalist will be consulted for admission.  Patient's left foot laceration was repaired while she was in the ER as well. [LT]  1017  Disposition-admit. [LT]    ED Course User Index [LT] Rock Dannielle Birmingham, MD    Medical Decision Making   External records were reviewed: I have reviewed Pulmonology Office Visit from 03/23/2023 which showed COPD. _________________________  [name] is a 83 y.o. female who presents to the ED with complaints of hypoglycemia. Per EMS, patient was found on the floor this morning minimally responsive with a cut on the left foot. Per EMS, patient's initial CBG was 41 and she was given D10 prior to arrival. Patient is unable to recall details of the fall. She denies any complaints at this time.. On my initial evaluation, patient was generally weak but now alert and oriented x 3.  She had a 3 cm laceration on her left foot and bleeding was controlled  The following differentials were considered: DDX: CHF, COPD, asthma, bronchitis, pnuemonia, MI, PE, URI, smoke inhalation, aspiration, allergic reaction, myopathy, neuropathy, psychogenic hyperventilation.    Pertinent studies were obtained, with results listed in chart above.  results interpreted as above.  Based on ED workup, findings are consistent with altered mental status, hypoglycemia, hypothermia, laceration of the left foot, acute UTI.    Patient received IV dextrose for her hypoglycemia and IV Cipro  for the UTI.  Patient was also placed on the Bair hugger for her hypothermia.  On reevaluation, symptoms are improved.  Clinical Assessment/Plan: Patient presented to the ER hypothermic and hypoglycemic, questionable just hypoglycemic episode versus a syncope episode that led to that.  Patient's initial workup was negative, except for the fact that she was hypothermic and she was also found to have a UTI.  She was started on IV fluids given some dextrose, and started on IV Cipro  for her urinary tract infection.  Urine cultures were collected.  The Bair hugger slowly increased her temperature back to normal.  Hospitalist was consulted for  admission not only to manage her hypoglycemia and hypothermia, but also to evaluate and rule out for syncope.  Discussion  of management or test interpretation with external provider(s): Hospitalist was consulted for admission   Clinical Complexity/Risk   Patient's presentation is most consistent with acute presentation with potential threat to life or bodily function.  Patient's multiple comorbidities increases the complexity of managing their  presentation with altered mental status and hypoglycemia.    Pt presentation is complicated by their history of COPD and DM, resulting in increased risk of recurrent admission  Provider time spent in patient care today, inclusive of but not limited to clinical reassessment, review of diagnostic studies, and discharge preparation, was greater than 30 minutes.  OTC medications: no, patient admitted Prescription medications discussed: no, patient admitted  ED Clinical Impression   Diagnoses that have been ruled out:  None  Diagnoses that are still under consideration:  None  Final diagnoses:  Altered mental status, unspecified altered mental status type  Hypoglycemia  Laceration of dorsum of left foot  Acute UTI  Hypothermia, initial encounter    ED Assessment/Plan   ED Disposition     ED Disposition  Admit   Condition  --   Comment  Primary Provider Group: Yes [1]  Provider Group:: [facility] Hospitalist Team [facility]) [[id number]]  Certification: I certify that inpatient services are medically necessary for this patient for a duration of greater than two midnights. See H&P and MD Progress Notes for additional information about the patient's course of treatment.          DISCHARGE MEDICATIONS   Medication List     ASK your doctor about these medications    albuterol  HFA 90 mcg/actuation inhaler Commonly known as: PROVENTIL  HFA;VENTOLIN  HFA;PROAIR  HFA Inhale 2 puffs every 6 (six) hours as needed.   amLODIPine 5 mg  tablet Commonly known as: NORVASC Take 1 tablet by mouth 2 (two) times a day.   aspirin 81 mg EC tablet Take 81 mg by mouth daily.   Daily Multivitamin-Minerals Tab Generic drug: multivitamin with minerals Take 1 tablet by mouth Once Daily.   gabapentin 600 mg tablet Commonly known as: NEURONTIN Take 600 mg by mouth 3 (three) times a day.   glimepiride 4 mg tablet Commonly known as: AMARYL Take 4 mg by mouth.   glipiZIDE 10 mg tablet Commonly known as: GLUCOTROL Take 10 mg by mouth daily.   insulin lispro 100 unit/mL injection Commonly known as: HumaLOG Inject  under the skin 3 (three) times a day before meals.   insulin NPH-insulin regular 100 unit/mL (70-30) injection Commonly known as: HumuLIN 70/30, NovoLIN 70/30 Inject 25 Units under the skin 2 (two) times a day.   ipratropium-albuteroL  0.5-2.5 mg/3 mL nebulizer solution Commonly known as: DUO-NEB every 6 (six) hours as needed.   Janumet 50-1,000 mg per tablet Generic drug: SITagliptin-metFORMIN Take 1 tablet by mouth 2 (two) times a day with meals.   levoFLOXacin 750 mg tablet Commonly known as: LEVAQUIN Take 750 mg by mouth every other day. Indications: hospital-acquired pneumonia due to Streptococcus pneumoniae   levothyroxine 100 mcg tablet Commonly known as: SYNTHROID Take 100 mcg by mouth every morning.   metFORMIN 500 mg 24 hr tablet Commonly known as: GLUCOPHAGE-XR Take 1 tablet by mouth nightly.   Omnipod Dash Pods (Gen 4) Crtg Generic drug: insulin pump cart,cont inf,BT every other day.   pramipexole 0.125 mg tablet Commonly known as: MIRAPEX Take 0.125 mg by mouth nightly.   rosuvastatin 20 mg tablet Commonly known as: CRESTOR Take 20 mg by mouth nightly.        FOLLOW UP  No follow-up provider specified.  ____________________________ Scribe's Attestation: This document serves as a record of services personally performed by Rock Birmingham, MD. It was created on their behalf by  Deatrice VEAR Bathe, Scribe, a trained medical scribe. The creation of this record is the provider's dictation and/or activities during the visit.   Electronically signed by: Rock Dannielle Birmingham, MD 09/15/2023 7:40 AM      *Some images could not be shown.

## 2023-09-15 NOTE — ED Notes (Signed)
 PA at bedside treating wound to left foot. Photos added to pt chart.    Tiffany Etha Minder, RN 09/15/23 1009

## 2023-09-15 NOTE — ED Notes (Signed)
 Pt glucose 55. MD made aware   Annabella Etha Minder, RN 09/15/23 [id number]

## 2023-09-15 NOTE — ED Triage Notes (Signed)
 Pt arrives via EMS. Pt found on floor by husband with cut on foot and minimally responsive. Initial blood glucose 41. D10 given by EMS

## 2023-09-15 NOTE — Care Plan (Signed)
 Received message from RN during cross coverage  hello, pt is having c/o L foot pain. she was admitted with a tear to her foot that required sutures in the ED. PRN tylenol  already given but pain is persistent. could something else be ordered for her if possible. thanks    Ordered oxcycodone 5mg  prn

## 2023-09-15 NOTE — Progress Notes (Addendum)
 Division of Pharmacy Services  Medication History Completion Note  Name/DOB/Age of Patient: [name] / 12/15/1940 / 83 y.o.  Location: [facility] ED  Type: Hospital admission & Modality: Phone  Confirmed two patient identifiers: Yes  Confirmed patient is alert & oriented: No  Medication History Source (Med History Informants):  Patient Family Member   Which family member responsible for patient's medication management/administration at home? (enter name/phone number): Daughter Fronie  Dispense History   Asked about any missing medications (such as pumps, injectable meds, TPN, OTC, etc): Yes  PTA Med List:  Prior to Admission Medications     Reviewed by Isaiah Earnie Graff on 09/15/23 at 1252    Medication Sig Last Dose Informant Taking? Status  albuterol  HFA (PROVENTIL  HFA;VENTOLIN  HFA;PROAIR  HFA) 90 mcg/actuation inhaler Inhale 2 puffs every 6 (six) hours as needed.    Active  amLODIPine (NORVASC) 5 mg tablet Once daily 09/14/2023 Daughter Yes Active         Med Note CORRINE ISAIAH HERO   Sat Sep 15, 2023 10:23 AM)   07/26/2023 5 mg tab (disp 90, 90d supply)    gabapentin (NEURONTIN) 600 mg tablet Take 600 mg by mouth 3 (three) times a day. Unknown Daughter No Active  glipiZIDE (GLUCOTROL) 10 mg tablet Take 10 mg by mouth daily. 09/14/2023 Daughter Yes Active         Med Note CORRINE ISAIAH HERO   Sat Sep 15, 2023 12:35 PM) LF 08.13.25 #90  insulin NPH-insulin regular (HumuLIN 70/30, NovoLIN 70/30) 100 unit/mL (70-30) injection Inject 25 Units under the skin 3 (three) times a day with meals as needed. 25 units in the morning, 25 units in the evening if needed, and 20-30 units at night 09/14/2023 Daughter Yes Active         Med Note CORRINE ISAIAH HERO   Sat Sep 15, 2023 12:35 PM) LF 09.02.25   ipratropium-albuteroL  (DUO-NEB) 0.5-2.5 mg/3 mL nebulizer solution every 6 (six) hours as needed.  Daughter  Active  levoFLOXacin (LEVAQUIN) 750 mg tablet Take 750 mg by mouth every other  day. Indications: hospital-acquired pneumonia due to Streptococcus pneumoniae  Patient not taking: Indications: hospital-acquired pneumonia due to Streptococcus pneumoniae. Reported on 09/15/2023   Not Taking Daughter No Flag for Review (Therapy completed)  levothyroxine (SYNTHROID) 112 mcg tablet Take 112 mcg by mouth every morning. 09/14/2023 Daughter Yes Active         Med Note CORRINE ISAIAH HERO   Sat Sep 15, 2023 10:23 AM) 08/15/2023 112 mcg tab (disp 90, 90d supply)  metFORMIN (GLUCOPHAGE-XR) 500 mg 24 hr tablet Take 1 tablet by mouth nightly. 09/14/2023 Daughter Yes Active         Med Note CORRINE ISAIAH HERO   Sat Sep 15, 2023 10:23 AM) 07/13/2023 500 mg Tb24 (disp 90, 90d supply)  multivitamin with minerals (Daily Multivitamin-Minerals) tab Take 1 tablet by mouth daily.  Daughter  Active  Omnipod Dash Pods, Gen 4, crtg every other day.  Daughter  Active  pramipexole (MIRAPEX) 0.125 mg tablet Take 0.125 mg by mouth nightly. 09/14/2023 Daughter Yes Active         Med Note CORRINE ISAIAH HERO   Sat Sep 15, 2023 10:23 AM) 08/15/2023 0.125 mg tab (disp 90, 90d supply)  rosuvastatin (CRESTOR) 20 mg tablet Take 20 mg by mouth nightly. 09/14/2023 Daughter Yes Active         Med Note CORRINE ISAIAH HERO   Sat Sep 15, 2023 10:23 AM) 09/13/2023 20 mg  tab (disp 90, 90d supply)  SITagliptin-metFORMIN (Janumet) 50-1,000 mg per tablet Take 1 tablet by mouth in the morning and 1 tablet in the evening. Take with meals.  Daughter  Active           Audit from Redirected Encounters   **Prior to Admission medications have not yet been reviewed for this encounter**     Selected Pharmacy:  [facility] - [place], [place] - [address] - PHONE: [phone] - FAX: [phone]  Comments: Medications verified by patients daughter Fronie. Daughter stated patient stopped aspirin 81 mg because her blood was too thin and she was taking them too often. Glimepiride 4 mg was replaced by glipizide. Removed  levofloxacin 750 mg, short course. Removed insulin Humalog patient only uses humulin 70/30. Daughter stated for insulin they have been doing 25 units in the morning, 25 units at noon if needed, and 20-30 units at night depending on blood sugar. Electronically signed by: Isaiah Earnie Graff 09/15/2023 12:51 PM  Medications reconciled by provider: No   Signature/Co-signature, if required: Isaiah Earnie Graff   Date/Time: 09/15/2023 12:52 PM

## 2023-09-15 NOTE — Care Plan (Signed)
  Problem: PAIN - ADULT Goal: Verbalizes/displays adequate comfort level or baseline comfort level Description: INTERVENTIONS: 1. Encourage pt to monitor pain and request assistance 2. Assess pain using appropriate pain scale 3. Administer analgesics based on type and severity of pain and evaluate response 4. Implement non-pharmacological measures as appropriate and evaluate response 5. Consider cultural and social influences on pain and pain management 6. Notify LIP if interventions unsuccessful or patient reports new pain Outcome: Progressing   Problem: INFECTION - ADULT Goal: Absence of infection during hospitalization Description: INTERVENTIONS: 1. Assess and monitor for signs and symptoms of infection 2. Monitor lab/diagnostic results 3. Monitor all insertion sites i.e., indwelling lines, tubes and drains 4. Monitor endotracheal (as able) and nasal secretions for changes in amount and color 5. Institute appropriate cooling/warming therapies per order 6. Administer medications as ordered 7. Instruct and encourage patient and family to use good hand hygiene technique 8. Identify and instruct in appropriate isolation precautions for identified infection/condition Outcome: Progressing Goal: Absence of fever/infection during anticipated neutropenic period Description: INTERVENTIONS 1. Monitor WBC 2. Administer growth factors as ordered 3. Implement neutropenic guidelines as ordered Outcome: Progressing   Problem: Safety - Adult Goal: Free from fall injury Description: INTERVENTIONS: 1. Assess pt frequently for physical needs 2. Identify cognitive and physical deficits and behaviors that affect risk of falls. 3. Institute fall precautions as indicated by assessment. 4. Educate pt/family on patient safety including physical limitations 5. Instruct pt to call for assistance with activity based on assessment 6. Modify environment to reduce risk of injury 7. Consider OT/PT consult  to assist with strengthening/mobility Outcome: Progressing Goal: Absence of infection during hospitalization Description: INTERVENTIONS: 1. Assess and monitor for signs and symptoms of infection 2. Monitor lab/diagnostic results 3. Monitor all insertion sites i.e., indwelling lines, tubes and drains 4. Monitor endotracheal (as able) and nasal secretions for changes in amount and color 5. Institute appropriate cooling/warming therapies per order 6. Administer medications as ordered 7. Instruct and encourage patient and family to use good hand hygiene technique 8. Identify and instruct in appropriate isolation precautions for identified infection/condition Outcome: Progressing   Problem: DISCHARGE PLANNING Goal: Discharge to home or other facility with appropriate resources Description: INTERVENTIONS: 1. Identify barriers to discharge w/pt and caregiver 2. Arrange for needed discharge resources and transportation as appropriate 3. Identify discharge learning needs (meds, wound care, etc) 4. Arrange for interpreters to assist at discharge as needed 5. Refer to Case Management Department for coordinating discharge planning if the patient needs post-hospital services based on physician order or complex needs related to functional status, cognitive ability or social support system Outcome: Progressing   Problem: Chronic Conditions and Co-Morbidities Goal: Patient's chronic conditions and co-morbidity symptoms are monitored and maintained or improved Description: INTERVENTIONS: 1. Monitor and assess patient's chronic conditions and comorbid symptoms for stability, deterioration, or improvement 2. Collaborate with multidisciplinary team to address chronic and comorbid conditions and prevent exacerbation or deterioration 3. Update acute care plan with appropriate goals if chronic or comorbid symptoms are exacerbated and prevent overall improvement and discharge Outcome: Progressing   Problem:  Risk for Fall-Universal Goal: Fall Prevention During Hospitalization-Universal Outcome: Progressing

## 2023-09-15 NOTE — ED Notes (Signed)
 Pt CAOx3 and unable to recall events leading to now. Pt cleaned of urine. Purewick in place. Rectal temp as documented. MD made aware. Bear Hugger in place. Call bell within reach.    Annabella Etha Minder, RN 09/15/23 [phone]

## 2023-09-15 NOTE — ED Notes (Signed)
 Bair Hugger applied at this time   [name], [place] 09/15/23 [phone]

## 2023-09-16 NOTE — Progress Notes (Signed)
 HOSPITAL MEDICINE -  PROGRESS NOTE  Hospital Day: 1  Admission Date:  09/15/2023  PCP:  Lonell JAYSON Collet, DO  Extended Emergency Contact Information Primary Emergency Contact: Bowen,Gwen Home Phone: [phone] Relation: Daughter  Discharge Readiness: Timeframe: 24-48H Dispo to: Home with home health Barriers to Discharge: as below  Subjective   Pt seen and examined by bedside she is completely alert and oriented denies any nausea vomiting or diarrhea denies any palpitations.  She is able to confirm that she only takes insulin twice a day and not 3 times a day she is much sharper this morning.  Assessment/Plan   Hospital Course: 83 y.o. year old female with a PMH of dementia, CKD, DM, GERD, HLD, hypertension, thyroid  disease presented to ED after being found on the floor by her husband with altered mental status.on admit, patient is alert and oriented to self, place and time but not situation.  Assessment & Plan Toxic metabolic encephalopathy Acute cystitis with hematuria Hypoglycemia  Hx of T2DM Presented to ED after being found on the floor by her husband with altered mental status.  Patient was found to be minimally responsive, hypoglycemic, hypothermic.  Per daughter, patient takes glipizide 10 mg, metformin 500 mg daily, Humulin 70/30 25 units 3 times daily depending on her glucose levels. - Mentation improving with improvement in hypothermia and hypoglycemia.  Continue to monitor - Will hold off on restarting home insulin. SSI for now - Consistent carb diet - Hypoglycemia protocol - Wound care for skin tears from fall at home   UA with 250 leukocyte esterase, 6-12 WBC, many bacteria. S/p single dose of Cipro  in ED. - Will utilize IV ceftriaxone, de-escalate as appropriate - Follow urine culture Hypoglycemia -Initially there was some confusion that she takes Humulin 70/30 25 units 3 times daily but she is able to confirm to me on 9/14 that she takes this only twice a  day which is appropriate dosing - Monitor blood sugars of 4 Type 2 diabetes mellitus, with long-term current use of insulin    (CMD)  Lab Results  Component Value Date   HGBA1C 10.6 (H) 09/15/2023  - Continue sliding scale for now of - Based on the A1c it appears that she definitely needs insulin at home but it might have dropped in the setting of concern for UTI/cystitis Hypokalemia  HTN (hypertension) HLD (hyperlipidemia) Hypothyroidism - Continue home medication for the above medical problems - There is a concern for polypharmacy and also concerned that patient might not be taking Synthroid on time which could also make her altered? -TSH appropriate       DVT Prophylaxis: Lovenox SubQ  Active Medications: amLODIPine, 5 mg, oral, Daily atorvastatin, 80 mg, oral, At Bedtime cefTRIAXone, 2 g, intravenous, Q24H enoxaparin, 40 mg, subcutaneous, At Bedtime gabapentin, 300 mg, oral, TID insulin aspart (NovoLOG)/insulin lispro (HumaLOG) injection (WF), 0-8 Units, subcutaneous, Before meals & nightly levothyroxine, 112 mcg, oral, QAM pramipexole, 0.125 mg, oral, Nightly     Infusions:    Objective   Temp:  [97.7 F (36.5 C)-98.4 F (36.9 C)] 97.8 F (36.6 C) Heart Rate:  [72-99] 85 Resp:  [14-25] 17 BP: (113-159)/(55-115) 143/57  Physical Exam On examination patient appeared in good health and spirits, multiple bruises on the face and body Vital signs as documented. Skin as mentioned in nursing notes Neck without JVD.  Lungs clear to auscultation b/l, no crackles, rales or wheezes Heart exam notable for regular rhythm, normal sounds and absence of murmurs, rubs or gallops.  Abd: soft NT/ND, BS +ve Extremities nonedematous.  Skin :  Wound 09/15/23 Skin Tear Arm Anterior;Left;Lower (Active)  Date First Assessed/Time First Assessed: 09/15/23 2040   Pre-Existing Wound: No  Primary Wound Type: Skin Tear  Location: Arm  Wound Location Orientation: Anterior;Left;Lower      Wound 09/15/23 Foot Anterior;Left (Active)  Date First Assessed/Time First Assessed: 09/15/23 2040   Pre-Existing Wound: Yes  Location: Foot  Wound Location Orientation: Anterior;Left     Labs   Pertinent labs reviewed    TotalTime   I have personally spent 63 minutes involved in face-to-face and non-face-to-face activities for this patient on the day of the visit.  Professional time spent includes chart review, obtaining history, physical exam, counseling, documenting, care coordination   Electronically signed by:   Kinchit K. Maree, MD FACP Clinical Assistant Professor Diplomat of American Board of Internal Medicine [facility] Department of Internal Medicine, Section of Hospital Medicine 09/16/2023 12:02 PM  Kinchit MARLA Maree, MD

## 2023-09-16 NOTE — Progress Notes (Signed)
 Case Management Screening  CSN: [id number] DOB: 10-23-1940 Service: General Medicine Location: 618/01   Initial Screening Readmission Risk Score v2: 14.7 Risk Level: Low - Patient does not meet high risk criteria for post hospital services. Social Investment banker, operational will be available to assist as indicated.  Per HPI: 83 y.o. year old female with a PMH of dementia, CKD, DM, GERD, HLD, hypertension, thyroid  disease presented to ED after being found on the floor by her husband with altered mental status. Patient was found to be minimally responsive, hypoglycemic, hypothermic.    Bernice Mania, RN

## 2023-09-17 NOTE — Care Plan (Signed)
 Problem: PAIN - ADULT Goal: Verbalizes/displays adequate comfort level or baseline comfort level Description: INTERVENTIONS: 1. Encourage pt to monitor pain and request assistance 2. Assess pain using appropriate pain scale 3. Administer analgesics based on type and severity of pain and evaluate response 4. Implement non-pharmacological measures as appropriate and evaluate response 5. Consider cultural and social influences on pain and pain management 6. Notify LIP if interventions unsuccessful or patient reports new pain Outcome: Progressing   Problem: INFECTION - ADULT Goal: Absence of infection during hospitalization Description: INTERVENTIONS: 1. Assess and monitor for signs and symptoms of infection 2. Monitor lab/diagnostic results 3. Monitor all insertion sites i.e., indwelling lines, tubes and drains 4. Monitor endotracheal (as able) and nasal secretions for changes in amount and color 5. Institute appropriate cooling/warming therapies per order 6. Administer medications as ordered 7. Instruct and encourage patient and family to use good hand hygiene technique 8. Identify and instruct in appropriate isolation precautions for identified infection/condition Outcome: Progressing   Problem: INFECTION - ADULT Goal: Absence of infection during hospitalization Description: INTERVENTIONS: 1. Assess and monitor for signs and symptoms of infection 2. Monitor lab/diagnostic results 3. Monitor all insertion sites i.e., indwelling lines, tubes and drains 4. Monitor endotracheal (as able) and nasal secretions for changes in amount and color 5. Institute appropriate cooling/warming therapies per order 6. Administer medications as ordered 7. Instruct and encourage patient and family to use good hand hygiene technique 8. Identify and instruct in appropriate isolation precautions for identified infection/condition Outcome: Progressing Goal: Absence of fever/infection during anticipated  neutropenic period Description: INTERVENTIONS 1. Monitor WBC 2. Administer growth factors as ordered 3. Implement neutropenic guidelines as ordered Outcome: Progressing   Problem: Safety - Adult Goal: Free from fall injury Description: INTERVENTIONS: 1. Assess pt frequently for physical needs 2. Identify cognitive and physical deficits and behaviors that affect risk of falls. 3. Institute fall precautions as indicated by assessment. 4. Educate pt/family on patient safety including physical limitations 5. Instruct pt to call for assistance with activity based on assessment 6. Modify environment to reduce risk of injury 7. Consider OT/PT consult to assist with strengthening/mobility Outcome: Progressing Goal: Absence of infection during hospitalization Description: INTERVENTIONS: 1. Assess and monitor for signs and symptoms of infection 2. Monitor lab/diagnostic results 3. Monitor all insertion sites i.e., indwelling lines, tubes and drains 4. Monitor endotracheal (as able) and nasal secretions for changes in amount and color 5. Institute appropriate cooling/warming therapies per order 6. Administer medications as ordered 7. Instruct and encourage patient and family to use good hand hygiene technique 8. Identify and instruct in appropriate isolation precautions for identified infection/condition Outcome: Progressing   Problem: DISCHARGE PLANNING Goal: Discharge to home or other facility with appropriate resources Description: INTERVENTIONS: 1. Identify barriers to discharge w/pt and caregiver 2. Arrange for needed discharge resources and transportation as appropriate 3. Identify discharge learning needs (meds, wound care, etc) 4. Arrange for interpreters to assist at discharge as needed 5. Refer to Case Management Department for coordinating discharge planning if the patient needs post-hospital services based on physician order or complex needs related to functional status,  cognitive ability or social support system Outcome: Progressing   Problem: Chronic Conditions and Co-Morbidities Goal: Patient's chronic conditions and co-morbidity symptoms are monitored and maintained or improved Description: INTERVENTIONS: 1. Monitor and assess patient's chronic conditions and comorbid symptoms for stability, deterioration, or improvement 2. Collaborate with multidisciplinary team to address chronic and comorbid conditions and prevent exacerbation or deterioration 3. Update acute care plan  with appropriate goals if chronic or comorbid symptoms are exacerbated and prevent overall improvement and discharge Outcome: Progressing   Problem: Risk for Fall-Universal Goal: Fall Prevention During Hospitalization-Universal Outcome: Progressing   Problem: Health Behavior: Goal: MCB Ability to state ways to decrease the risk of falls will be met by discharge Description: Ability to state ways to decrease the risk of falls will improve by discharge Outcome: Progressing   Problem: Safety: Goal: Will remain free from falls by discharge Description: Will remain free from falls by discharge Outcome: Progressing

## 2023-09-17 NOTE — Unmapped External Note (Signed)
 Acute Physical Therapy Evaluation Co-eval with OT  Precautions: Other Therapy Precautions: Fall risk Comment: L foot laceration dorsum of foot   Medical Diagnosis/Course:  PT Diagnosis/Course Pertinent Medical Course: Patient admitted 9/13 after found on the floor at home by her husband, unresponsive, L dorsal foot laceration, and blood sugars of 41. pt dx with toxic metabolic encephalopathy with AKI and acute cystitis with hematuria, hypoglycemia, and initial hypothermia in the ED. pt required sutures for L foot laceration.  Imaging: CXR, CT spine and CT head all negative for acute event  ASSESSMENT SUMMARY   Patient is a 83 y.o. female admitted to [facility] 09/15/2023 for above noted medical diagnosis/course. Significant PMH includes: T2DM, HTN, HLD, GERD, thyroid  disease, dementia, COPD..  Functional level prior to admission: pt lives with her husband with hx of dementia and PRN supervision and assist from dtr. pt has assist from dtr for IADLs including meds, cooking and cleaning. pt typically mod I with ADLs but needing supervision /reminders to perform. She does not use an assistive device for ambulation per pt. pt does not drive, dtr drives pt when needed. Information obtained from previous PT and OT notes in chart and partially from pt due to pt memory deficits and is poor historian with hx of dementia..   A Moderate Complexity Physical Therapy evaluation was completed this date.  Currently, patient requires CGA for transfers and gait with a RW due to L foot pain limiting WB L foot. Pt with baseline cognitive deficits complicating with VC and TC for safety and risk for falls. This is a decline from functional baseline with deficits noted in problem list below that limit their ability to perform functional mobility safely at maximal independence level. Pt deficits present as [employer] AM-PACT "6 Clicks" Basic Mobility score of 20 indicating 38.32% of functional impairment.  Pt will  benefit from skilled PT services acutely and at discharge in the home health PT setting to address their deficits to maximize their safety, independence, and activity tolerance for functional mobility with decreased caregiver burden in the least restrictive environment.   In order to allow for interdisciplinary care coordination, patient safety, and discharge planning, PT Evaluation was performed in conjunction with Occupational Therapy.   Problem list:  Rehab Potential Rehab Potential: Good Complexity/Co-morbidities that Impact POC: Obesity, Pain, Diabetes, Cognitive/Memory deficits, Reduced activity level, Pulmonary compromise Impairments/Limitations: Activity Tolerance Deficits, Ambulation Deficits, Mobility deficits, Transfer deficits, Pain limiting function, Safety Awareness deficits, Balance Deficits   PT Recommendation: PT Recommendation: Home with intermittent assistance, Home PT PT Equipment Recommended: Walker-rolling  Home Living Environment: Type of Home House  Home Layout One level  Exterior Stairs - number 1 R hand rail  Exterior Stairs - rails Right going up  [employer] - number    Interior Stairs - Administrator, sports / Tub Tub/Shower Unit (has shower chair but does not use)  Bathroom Equipment Raised toilet height (handicapped height commode per chart)  Nurse, learning disability    Home Equipment  (none per pt)  Equipment Currently Using none per pt  Additional Comments pt lives with her husband with hx of dementia and PRN supervision and assist from dtr. pt has assist from dtr for IADLs including meds, cooking and cleaning. pt typically mod I with ADLs but needing supervision /reminders to perform. She does not use an assistive device for ambulation per pt. pt does not drive, dtr drives pt when needed. Information obtained from previous PT and OT notes in chart and partially  from pt due to pt memory deficits and is poor historian with hx of dementia.   Prior Level of  Function: Level of Independence Requiring assistance  Lives With Spouse (? spouse with dementia)  Person(s) able to assist at d/c dtr PRN for supervision and assist  Patient Responsibilities Personal ADLs  Requires Assist With Home Management, Functional Cognition   Fall History: Subjective Fall History Fall in the last year?: Yes Fall/Injury Details: L foot lacetation, reason for admit due to above noted dx Feel unsteady or off balance?: No (denies feeling unsteady)  PERTINENT MEDICAL INFORMATION   PT Treatment Diagnosis:    Difficulty Walking (not elsewhere specified), Reduced Mobility, Unsteadiness on feet, Fall risk, and Decreased activity tolerance  Past Medical History: Medical History[1]  Past Surgical History: Surgical History[2]  Lines and Leads: bed/chair exit alarm, saline lock, and telemetry, external cath removed during visit - RN notified for up to [facility] with assist and RW for safety     SUBJECTIVE   Communication:  Communication: Patient, Nursing, Clinical Case Management, MD, OT Communication Details: pt cleared by RN for tx. Discussed PLOF, DME and safe d/c recommendations with patient. Notified case management, RN and provider via epic chat of d/c and DME recommendations. RN alerted to pt's resting position and functional performance during eval.    Subjective: pt alert in bed, agreeable to PT.    Assessed in the Presence nq:Ozphy, OT   Clearance/Consent for therapy provided by: RN  OBJECTIVE   Pain: Pain Assessment: 0-10 Pain Score  : 5 Pain Type: Acute pain Pain Location: Foot Pain Orientation: Left Pain Descriptors: Aching, Discomfort Effect of Pain on Daily Activities: pain with WB for transfers and ambulation Patient's Stated Pain Goal: No pain Pain Intervention(s): Repositioned, Emotional support, Other (Comment), Rest, Elevated (RW for decreased WB with use of UE support) Response to Interventions: decreased pain at rest in recliner with  LEs elevated   Cognition: Orientation Level: Oriented x4 Safety Awareness: Impaired Overall Cognitive Status: Baseline Cognitively Impaired (hx of dementia) Attention: Attends with cues to redirect Memory: Decreased recall of recent events, Decreased short term memory Insight: Decreased awareness of deficits  Hearing: HOH  Range of Motion: Overall ROM Assessment: Bilateral LEs WFL  Strength: Overall Strength Assessment: Bilateral LEs WFL  Coordination: Coordination Additional Comments: WFL for functional mobility and Sensation: Sensation Numbness: denies N and T  Skin Integrity and Edema:  Skin Integrity & Edema Skin: Intact except Exceptions: L foot with bandage in place over laceration with sutures Edema: Non-Pitting Part of Body: L foot and ankle  Balance: Sitting - Static: Independent Sitting - Dynamic: Independent Standing - Static: Close supervision Standing - Dynamic: Contact Guard Assistance Balance Additional Comments: without assistive device in standing, close supervision with RW  for both standing balances  Functional Mobility:  Bed Mobility Rolling: Independent Supine to Sit: Independent Bed Mobility Additional Comments: mod I for supine to/from sit using bed rails and HOB elevated 45 degrees, scoots to EOB anteriorly and posteriorly in chair mod I Transfers Assistive Device: Walker-rolling Sit to Stand: Geophysicist/field seismologist (at bed without assistive device) Stand to Sit: Geophysicist/field seismologist (CGA with VC for safe use of hands , backing fully to chair with posterior legs touching and RW) Transfers  Additional Comments: VC and TC for safe hand placement to faciliate sit to/from stand and controlled descent Mobility Gait Distance (ft): 25 ft Gait Additional Comments: distance limited by L foot pain with laceration with sutures, edema, VC and TC  for UE support with RW for decreased WB on L LE for comfort and safety Gait Assistance: Youth worker Device: Walker-rolling Gait Deviation: Antalgic Gait-Left, Cadence- Decreased, Decreased Stance Time-Left, Decreased stride length- Right, Decreased stride length -Left  Therapy Activity Vitals/ Response to treatment Mild SOB noted 1/4 dyspnea rating with mobility, L foot pain limiting prolonged ambulation  Condition After Therapy:  Up in chair, Nursing notified of condition, All needs within reach, Alarm activated, Fall interventions in place, Lines intact    INTERVENTIONS   Fall risk, safety education, instructed in use of RW for support for L foot pain and for safety. Pt demos understanding  Skill Performed by Clinician: Education on proper use of assistive device/adaptive equipment Education on precautions for safe performance of functional tasks Education on safe/proper technique Monitoring exercise/activity tolerance Monitoring safe progression of exercise/activity Tactile cues for correction of atypical postures or movements Tactile cues for proper technique Tactile cues for safe execution of functional tasks Verbal cues for proper technique Verbal cues for safe execution of functional tasks  Education:  Education provided to: Patient Education provided regarding:  Role and POC of OT, Role and POC of PT, Importance of increasing activity, Call and wait for assistance, Provided HEP, Discharge recs, Fall Prevention, and Use of assistive device Response to Education: Patient verbalizes understanding, Patient demonstrates understanding, and Patient needs continued reinforcement  PERFORMANCE OUTCOME MEASURES   AM-PAC - Basic Mobility:    Flowsheet Row Most Recent Value  AM-PAC 6-Clicks - Basic Mobility  Turning from you back to your side while in a flat bed without using bed rails? None  Moving from lying on your back to sitting on the side of a flat bed without using bed rails? None  Moving to and from a bed to a chair (including a wheelchair)? A  little  Standing up from a chair using your arms (e.g, wheelchair, or bedside chair)? A little  To walk in a hospital room? A little  Climbing 3-5 steps with a railing? A little  AM-PAC Total Score 20    PT PLAN OF CARE   Rehab Potential:  Rehab Potential: Good Complexity/Co-morbidities that Impact POC: Obesity, Pain, Diabetes, Cognitive/Memory deficits, Reduced activity level, Pulmonary compromise Impairments/Limitations: Activity Tolerance Deficits, Ambulation Deficits, Mobility deficits, Transfer deficits, Pain limiting function, Safety Awareness deficits, Balance Deficits  Plan:  Plan Patient and Family Goals: decreased pain L foot, return home Treatment Plan/Goals Established with Patient/Caregiver: Yes PT Frequency: Discharge PT Duration: Discharge  PT Goals:    TREATMENT TIME   Time In: 1050 Time Out: 27-Sep-1105 Time in Timed codes:   Total Treatment Time: 17 PT Eval Mod Complexity minutes: 17       Charges           09/17/2023   Code Description Service Provider Modifiers Quantity  [id number] Hc Pt Physical Therapy Evaluation Mod Complex 30 Mins Elvie CHRISTELLA Agent, PT GP 1        Time of Service Note Type Status  None           PT Evaluation Complexity History of Comorbidities/Personal Care Impacting Plan of Care: 3-4 personal factors and/or comorbidities Examination of Body Systems: 3 or more elements Presentation of Characteristics: Evolving with changing characteristics PT Eval Complexity Score: 21 PT Eval Complexity Score: Moderate Complexity        [1] Past Medical History: Diagnosis Date  . Chronic kidney disease   . Diabetes mellitus    (CMD)   .  GERD (gastroesophageal reflux disease)   . Hypercholesterolemia   . Hypertension   . Thyroid  disease   [2] Past Surgical History: Procedure Laterality Date  . APPENDECTOMY     Procedure: APPENDECTOMY  . EYE SURGERY     Procedure: EYE SURGERY  . HYSTERECTOMY      Procedure: HYSTERECTOMY

## 2023-09-17 NOTE — Care Plan (Signed)
 [facility] at [facility] Inpatient Unit Quarterback - Acceptance Note   The patient has been accepted for transfer to [facility] H@H  Inpatient Unit. The orders will be placed and reconciled.  Patient is agreeable to H@H  transfer.  This patient's medical record has been reviewed and discussed with the attending and/or the assigned APP.  The H@H  Nurse Navigator has also discussed the details of the H@H  program and the plan to transfer with the patient and/or the patient's caregiver.   Transfer summary:  [name] is an 83 year old female with a history of dementia, chronic kidney disease, type 2 diabetes mellitus (on long-term insulin), hypertension, hyperlipidemia, hypothyroidism, and GERD who was admitted on 09/15/2023 after being found on the floor at home with altered mental status, hypoglycemia, and hypothermia.  Patient was managed for acute cystitis with antibiotics, electrolytes repleted and insulin adjusted.  Transferred to inpatient hospital 8/9 5/15 for further management  Toxic metabolic encephalopathy CT head negative.  Suspect related to infection, electrolyte abnormalities and low sugars  Acute cystitis with hematuria Continue Rocephin and follow-up cultures  Hypoglycemia in the setting of DM type II, HbA1c 10.6  Patient confused about home insulin dosing.  Currently managed with sliding scale-will continue, assess p.o. intake at home and resume home insulin after confirmation at a lower dose with plan to titrate based on p.o. intake, CBG trend at home  Hypothyroidism on Synthroid Hypertension on Norvasc 5 mg daily Chronic pain on gabapentin 300 mg 3 times daily RLS on Mirapex  DVT prophylaxis: Discontinue Lovenox.  Encourage ambulation at home but maintain low threshold to start chemical prophylaxis if mobility poor   H@H  Plan of Care: [facility] administered medications/procedures: RPM set up  Medication delivery and education Current Health Remote Monitoring Pathway settings: Set  pathway as: Set pathway as: General Monitoring Pathway Continue with alarm settings per selected pathway defaults. Continuous remote nursing care

## 2023-09-17 NOTE — Care Plan (Signed)
 Problem: PAIN - ADULT Goal: Verbalizes/displays adequate comfort level or baseline comfort level Description: INTERVENTIONS: 1. Encourage pt to monitor pain and request assistance 2. Assess pain using appropriate pain scale 3. Administer analgesics based on type and severity of pain and evaluate response 4. Implement non-pharmacological measures as appropriate and evaluate response 5. Consider cultural and social influences on pain and pain management 6. Notify LIP if interventions unsuccessful or patient reports new pain Outcome: Progressing   Problem: INFECTION - ADULT Goal: Absence of infection during hospitalization Description: INTERVENTIONS: 1. Assess and monitor for signs and symptoms of infection 2. Monitor lab/diagnostic results 3. Monitor all insertion sites i.e., indwelling lines, tubes and drains 4. Monitor endotracheal (as able) and nasal secretions for changes in amount and color 5. Institute appropriate cooling/warming therapies per order 6. Administer medications as ordered 7. Instruct and encourage patient and family to use good hand hygiene technique 8. Identify and instruct in appropriate isolation precautions for identified infection/condition Outcome: Progressing Goal: Absence of fever/infection during anticipated neutropenic period Description: INTERVENTIONS 1. Monitor WBC 2. Administer growth factors as ordered 3. Implement neutropenic guidelines as ordered Outcome: Progressing   Problem: Safety - Adult Goal: Free from fall injury Description: INTERVENTIONS: 1. Assess pt frequently for physical needs 2. Identify cognitive and physical deficits and behaviors that affect risk of falls. 3. Institute fall precautions as indicated by assessment. 4. Educate pt/family on patient safety including physical limitations 5. Instruct pt to call for assistance with activity based on assessment 6. Modify environment to reduce risk of injury 7. Consider OT/PT consult  to assist with strengthening/mobility Outcome: Progressing Goal: Absence of infection during hospitalization Description: INTERVENTIONS: 1. Assess and monitor for signs and symptoms of infection 2. Monitor lab/diagnostic results 3. Monitor all insertion sites i.e., indwelling lines, tubes and drains 4. Monitor endotracheal (as able) and nasal secretions for changes in amount and color 5. Institute appropriate cooling/warming therapies per order 6. Administer medications as ordered 7. Instruct and encourage patient and family to use good hand hygiene technique 8. Identify and instruct in appropriate isolation precautions for identified infection/condition Outcome: Progressing   Problem: DISCHARGE PLANNING Goal: Discharge to home or other facility with appropriate resources Description: INTERVENTIONS: 1. Identify barriers to discharge w/pt and caregiver 2. Arrange for needed discharge resources and transportation as appropriate 3. Identify discharge learning needs (meds, wound care, etc) 4. Arrange for interpreters to assist at discharge as needed 5. Refer to Case Management Department for coordinating discharge planning if the patient needs post-hospital services based on physician order or complex needs related to functional status, cognitive ability or social support system Outcome: Progressing   Problem: Chronic Conditions and Co-Morbidities Goal: Patient's chronic conditions and co-morbidity symptoms are monitored and maintained or improved Description: INTERVENTIONS: 1. Monitor and assess patient's chronic conditions and comorbid symptoms for stability, deterioration, or improvement 2. Collaborate with multidisciplinary team to address chronic and comorbid conditions and prevent exacerbation or deterioration 3. Update acute care plan with appropriate goals if chronic or comorbid symptoms are exacerbated and prevent overall improvement and discharge Outcome: Progressing   Problem:  Risk for Fall-Universal Goal: Fall Prevention During Hospitalization-Universal Outcome: Progressing   Problem: Health Behavior: Goal: MCB Ability to state ways to decrease the risk of falls will be met by discharge Description: Ability to state ways to decrease the risk of falls will improve by discharge Outcome: Progressing   Problem: Safety: Goal: Will remain free from falls by discharge Description: Will remain free from falls by discharge Outcome:  Progressing

## 2023-09-17 NOTE — Consults (Signed)
 Wound Care Consult Note  Patient Name: [name] Date Of Birth: 07/19/1940 Medical Record Number: [id number]  Patient Age:  83 y.o. Attending Physician: Daneen MARLA Fairly, MD   Today's Date: 09/17/2023   Admission Date: 09/15/2023 Hospital Day: 2 Unit; [facility] SURG Bed: 618/01   Allergies[1]  Last Documented Vitals: BP (!) 144/57 (BP Location: Left arm, Patient Position: Sitting)   Pulse 77   Temp 98 F (36.7 C) (Oral)   Resp 17   Ht 1.499 m (4' 11)   Wt 85.7 kg (188 lb 15 oz)   SpO2 92%   BMI 38.16 kg/m   Recent Labs: Recent Labs    09/17/23 0232  WBC 10.85  HGB 10.2*  HCT 30.1*   Reason For Consult:  Wound care consult received for treatment recommendation for skin tears to the left arm and left foot.  Assessment: Pt admitted on 09/15/23 for altered mental status and hypoglycemia. PMH of dementia, CKD, DM, GERD, HLD, hypertension, thyroid  disease. Pt alert, pleasant and forgetful. Introduced wound care and explained reason of visit. Pt agrees and allows me to proceed. Wound picture taken with pt's permission.  Wound Assessment: Left forearm; Skin tear with partial skin loss. Description: red wound bed Measurements: 0.3 cm x 3.0 cm Drainage: small amount of serosanguinous drainage. Periwound intact with healing skin tear to the superior area.   Left foot: dorsal: approximated laceration with intact suture. No drainage noted.\   Recommendation: Gently cleanse left forearm and left foot wound with vashe wound cleanser. Then cover with mepilex lite for protection and atraumatic dressing. Change every Mon, Wed, Fri and prn soiling. Discussed recommendation with pt and nurse, [place] who verbalized understanding.  Signed: Hadassah Lela Lyme, RN CWON Date: 09/17/2023  Time: 12:22 PM       [1] Allergies Allergen Reactions  . Penicillins Hives, Itching and Other (See Comments)    hives  . Sulfa (Sulfonamide Antibiotics) Hives  . Penicillin Itching  .  Aspirin Rash

## 2023-09-20 NOTE — Nursing Note (Signed)
      Patient ID: [name] is a 83 y.o. female.   HPI Kyree Fedorko is being seen today by [facility]) for [facility] visit by Curtistine JINNY Lamprey. Patient assessment is completed including vital signs withNormal Findings. Patient Denies Shortness of breath , Chest Pain, Abd Pain, or N/V/D.   Vital Signs:  09/20/2023 Patient Vitals for the past 24 hrs:  BP Temp Temp src Pulse Resp SpO2 Weight  09/20/23 0942 150/80 98.2 F (36.8 C) Tympanic 80 20 93 % --  09/20/23 0400 -- -- -- 76 19 90 % --  09/20/23 0000 -- -- -- 84 (!) 22 91 % --  09/19/23 1900 137/81 -- -- -- -- 98 % --  09/19/23 1552 130/66 98 F (36.7 C) Tympanic 80 18 96 % --  09/19/23 1510 -- -- -- 79 19 96 % --  09/19/23 1027 130/70 98.9 F (37.2 C) Tympanic 88 20 96 % 85.6 kg (188 lb 11.4 oz)   O2 Device: None (Room air)    Environment Screening: Oceanographer Screen Completed: Surveyor, minerals Screen Reassess: No change Living Arrangements: Lives with spouse, Lives with paid help Patient Lives With Other Person(s): Around the clock Patient Lives In [place] Setting: Around the clock Patient Receives Assistance From: Paid help Mobility Barriers Present In Home: None Structural Barriers Present In Home: Stairs outside home Environment Safety Hazards: None Pharmacist, community Issues: None   Pertinent ROS Charting Type Charting Type: Shift assessment Neurological Neuro (WDL): Within Defined Limits Level of Consciousness: Alert HEENT HEENT (WDL): Within Defined Limits Gastrointestinal Gastrointestinal (WDL): Within Defined Limits Cardiac Cardiac (WDL): Within Defined Limits Psychosocial Psychosocial (WDL): Within Defined Limits Genitourinary Genitourinary (WDL): Within Defined Limits  Lung sounds were assessed and chest clear, no wheezing, rales, normal symmetric air entry.  MEDICATIONS: Medications  were reviewed: Yes All Medications were accounted for: Yes  Visit Summary: Arrived to find the patient sleeping. Patient alert and at normal baseline. Skin warm and dry. Patient denied any new pain or complaints. Patient was assisted in taking her morning medicine. Video call initiated with provider. Caregiver was not at the house. Caregiver was contacted on the phone and advised she would be home for the afternoon visit. Caregiver was given a patient update. Caregiver advised she was on the way back to the residence. Patient has no questions.  TASK PERFORMED/ MED'S GIVEN: Medications Given Morning medicine  EDUCATION PROVIDED: meds  OTHER: Interpreter was used today:No Pt is being Monitored by Current Health RPM: Yes  09/20/2023  9:59 AM Curtistine JINNY Lamprey, EMT-P

## 2023-09-20 NOTE — Discharge Summary (Signed)
 [facility]  Name: [name] Age: 83 yrs  MRN: [id number] DOB: 08-07-1940  Admit Date: 09/15/2023   Admitting Physician: Raenelle Jody Blank, MD  Discharge Date and Time: 09/20/2023 8:06 PM  Discharge Physician: Peyton Norris [place]     Discharge Diagnoses: Principal Problem (Resolved):   Toxic metabolic encephalopathy Active Problems:   Type 2 diabetes mellitus, with long-term current use of insulin    (CMD)   HTN (hypertension)   HLD (hyperlipidemia)   Hypothyroidism   Moderate dementia (CMD) Resolved Problems:   Hypoglycemia   Hypokalemia   Acute cystitis with hematuria  OP [facility] consulted for DM management   Hospital Course:  For the details of admission, please see the H&P on 09/15/2023.  For full details, please see progress notes, consult notes and ancillary notes.  The patient's hospital course will be summarized in a problem based approach below.   [name] is an 83 year old female with a history of dementia, chronic kidney disease, type 2 diabetes mellitus (on long-term insulin), hypertension, hyperlipidemia, hypothyroidism, and GERD who was admitted on 09/15/2023 after being found on the floor at home with altered mental status, hypoglycemia, and hypothermia.  Patient was managed for acute cystitis with antibiotics, electrolytes repleted and insulin adjusted.  Transferred to inpatient hospital 8/9 5/15 for further management  Toxic metabolic encephalopathy CT head negative.  Suspect related to infection, electrolyte abnormalities and low sugars [place] back to baseline, resolved  Dementia Needs 24/7 caregiver, daughter has caregiver moving in with them. Advised they cannot be left alone due to risk of hurting themselves.   Acute cystitis with hematuria Continued Rocephin due to difficulty with cognition and consistent meds Completed 5 days ctx   Hypoglycemia in the setting of DM type II, HbA1c 10.6  Patient confused about  home insulin dosing.   Currently managed with humalog with meals - patient eating or not eating throughout the day,  Discussed janumet with daughter, she would prefer GLP1i because of patient's excessive hunger. Caregiver needed to put a lock on the fridge and hide cards in the house so the patient wouldn't get into it.  Previously on glipizide -- will d/c on discharge due to hypoglycemia risk   Hypothyroidism on Synthroid Hypertension on Norvasc 5 mg daily Chronic pain on gabapentin 300 mg 3 times daily RLS on Mirapex     BP 144/88 (BP Location: Left arm, Patient Position: Sitting)   Pulse 80   Temp 98.9 F (37.2 C) (Tympanic)   Resp 20   Ht 1.499 m (4' 11)   Wt 85.6 kg (188 lb 11.4 oz)   SpO2 96%   BMI 38.12 kg/m    Discharge Medications:    Discharge Medications     New Medications      Sig Disp Refill Start End  Mounjaro 2.5 mg/0.5 mL subcutaneous pen injector Generic drug: tirzepatide  Inject 2.5 mg under the skin every 7 days.  2 mL  1         Modified Medications      Sig Disp Refill Start End  insulin NPH-insulin regular 100 unit/mL (70-30) injection Commonly known as: HumuLIN 70/30, NovoLIN 70/30 What changed:  how much to take reasons to take this additional instructions  Inject 10 Units under the skin 3 (three) times a day with meals as needed for high blood sugar.   0         Medications To Continue      Sig Disp Refill  Start End  amLODIPine 5 mg tablet Commonly known as: NORVASC  Take 1 tablet by mouth 2 (two) times a day.   0     Daily Multivitamin-Minerals Tab Generic drug: multivitamin with minerals  Take 1 tablet by mouth daily.   0     gabapentin 600 mg tablet Commonly known as: NEURONTIN  Take 600 mg by mouth 3 (three) times a day.   0     ipratropium-albuteroL  0.5-2.5 mg/3 mL nebulizer solution Commonly known as: DUO-NEB  every 6 (six) hours as needed.   0     levothyroxine 100 mcg tablet Commonly known as:  SYNTHROID  Take 100 mcg by mouth every morning.   0     pramipexole 0.125 mg tablet Commonly known as: MIRAPEX  Take 0.125 mg by mouth nightly.   0     rosuvastatin 20 mg tablet Commonly known as: CRESTOR  Take 20 mg by mouth nightly.   0         Stopped Medications    albuterol  HFA 90 mcg/actuation inhaler Commonly known as: PROVENTIL  HFA;VENTOLIN  HFA;PROAIR  HFA   glipiZIDE 10 mg tablet Commonly known as: GLUCOTROL   Janumet 50-1,000 mg per tablet Generic drug: SITagliptin-metFORMIN   levoFLOXacin 750 mg tablet Commonly known as: LEVAQUIN   metFORMIN 500 mg 24 hr tablet Commonly known as: GLUCOPHAGE-XR   Omnipod Dash Pods (Gen 4) Crtg Generic drug: insulin pump cart,cont inf,BT        Disposition:   Patient discharged to home in Fair condition.  Discharge Orders     Ambulatory referral to Telehospitalist     Full Code     Home Health Skilled Nursing     Details:    Home Health Skilled Nursing Service:  Medication Wound Care     Medication Education: Observation and Assessment with Medication Education and Mgmt   Education on Wound Care: Yes   Monitoring for Healing: Yes   Wound Location: Left Foot and Left forearm   Dressing Change Type:  Comment - Vashe   Dressing Change Frequency: MWF and when soiled   Wound Other Instructions/Comments: Gently cleanse left forearm and left foot wound with vashe wound cleanser. Then cover with mepilex lite for protection and atraumatic dressing. Change every Mon, Wed, Fri and prn soiling.   Negative Pressure: No   Lifting Limits:     Details:    Lifting Limits: No lifting limits   Occupational Therapy Home Health Coordination     Details:    Actions: Evaluate and Treat   Physical Therapy Home Health Coordination     Details:    Actions: Evaluate and Treat   Walker     Details:    Height (in inches): 1.499 m (4' 11)   Weight (in lbs.): 85.7 kg (188 lb 15 oz)   Walker Options: Rolling   Walker Assessories:  Glide Caps   Length of need: Lifetime        Already scheduled appointments (if any): Future Appointments  Date Time Provider Department Center  09/23/2023 11:30 AM [facility]          -----------------------------------------------------------------------------------------------------------------------------------------------------------------------------------------------------------------------------------------------------------------------------------------------------------------------------  Location Information: Patient State (at time of visit): [place]  Patient Location (at time of visit):Home/Other Non-Medical  Provider Location: Home Is provider licensed to provide clinical care in the current location/state of the patient? Yes   Consent:  Patient's identity was confirmed. Presenting condition or illness was discussed with the patient/personal representative. Current  proposed treatment for presenting condition or illness was explained to patient/personal representative along with the likely benefits and any significant risks or complications associated with the provision of treatment by audio/video means. The patient/personal representative verbally authorized treatment to be provided by audio/video, which may include a limited review of patient's current health status, medication, or other treatment recommendations, patient education, and an opportunity to ask questions about condition and treatment. Verbal Consent Granted by Patient/Personal Representative:Yes   Visit Information: Modality: 2-Way Real-Time Audio/Video   Video Stop Time: 952a Video Total Time: 4 min  Discussion with daughter end 45   I have personally spent 35 minutes involved in face-to-face and non-face-to-face activities for this patient on the day of the visit.  Professional time spent includes the following activities, in addition to those  noted in the documentation: Chart review, documentation, blood work/results review and care coordination.  -----------------------------------------------------------------------------------------------------------------------------------------------------------------------------------------------------------------------------------------------------------------------------------------------------------------------------  Electronically signed by: Peyton Almarie Dodge, MD 09/20/2023 8:06 PM

## 2023-09-20 NOTE — Progress Notes (Signed)
 Case Management Discharge Note        CSN: [id number] DOB: May 06, 1940 Service: General Medicine Location: [id number]/Pool  Patient Class: Inpatient  DC Disposition: : Home Health Care  Discharge DC Disposition: : Home Health Care Homecare Referral: (PT) Physical Therapy, (OT) Occupational Therapy, (SN) Skilled Nursing Homecare/Hospice Agency(s) chosen: [facility]  Discharge Referrals Case closed, patient/family agree with disposition plan: Yes     Patient Discharge home from [facility] program in stable condition, HHPTOTSN arranged with [facility], Powell Cook (P): [phone] liaison for [facility] made aware about discharge, CM confirmed with patient that patient do have RW, arranged by [facility]. No needs identified. Patient agrees discharge plan.    Arzella Blanch, RN Case Manager  Work (P): [phone] In-house staff only

## 2023-09-20 NOTE — Telephone Encounter (Signed)
 Referring provider: [place] Medic visit 9-21 for DM mgmt   Confirmed/copay discussed Pt/family will call for f/u appt if not made by facility  Annabella Moats Patient Care Advocate [facility])  Secure Chat Group: [facility] Telehospitalist  [phone]

## 2023-09-20 NOTE — Care Plan (Signed)
  Problem: Management of home medications Goal: Patient/caregiver/family will follow prescribed medication therapy and schedule, and verbalize understanding of purpose and side effects of medication Outcome: Progressing   Problem: Risk for Fall High-[facility] Goal: Patient will remain free of falls-[facility]H Outcome: Progressing

## 2023-09-21 ENCOUNTER — Telehealth: Payer: Self-pay

## 2023-09-21 DIAGNOSIS — K219 Gastro-esophageal reflux disease without esophagitis: Secondary | ICD-10-CM | POA: Diagnosis not present

## 2023-09-21 DIAGNOSIS — N390 Urinary tract infection, site not specified: Secondary | ICD-10-CM | POA: Diagnosis not present

## 2023-09-21 DIAGNOSIS — N189 Chronic kidney disease, unspecified: Secondary | ICD-10-CM | POA: Diagnosis not present

## 2023-09-21 DIAGNOSIS — E039 Hypothyroidism, unspecified: Secondary | ICD-10-CM | POA: Diagnosis not present

## 2023-09-21 DIAGNOSIS — Z556 Problems related to health literacy: Secondary | ICD-10-CM | POA: Diagnosis not present

## 2023-09-21 DIAGNOSIS — Z7984 Long term (current) use of oral hypoglycemic drugs: Secondary | ICD-10-CM | POA: Diagnosis not present

## 2023-09-21 DIAGNOSIS — Z87891 Personal history of nicotine dependence: Secondary | ICD-10-CM | POA: Diagnosis not present

## 2023-09-21 DIAGNOSIS — E11649 Type 2 diabetes mellitus with hypoglycemia without coma: Secondary | ICD-10-CM | POA: Diagnosis not present

## 2023-09-21 DIAGNOSIS — E876 Hypokalemia: Secondary | ICD-10-CM | POA: Diagnosis not present

## 2023-09-21 DIAGNOSIS — S51812D Laceration without foreign body of left forearm, subsequent encounter: Secondary | ICD-10-CM | POA: Diagnosis not present

## 2023-09-21 DIAGNOSIS — E1122 Type 2 diabetes mellitus with diabetic chronic kidney disease: Secondary | ICD-10-CM | POA: Diagnosis not present

## 2023-09-21 DIAGNOSIS — I129 Hypertensive chronic kidney disease with stage 1 through stage 4 chronic kidney disease, or unspecified chronic kidney disease: Secondary | ICD-10-CM | POA: Diagnosis not present

## 2023-09-21 DIAGNOSIS — S91312D Laceration without foreign body, left foot, subsequent encounter: Secondary | ICD-10-CM | POA: Diagnosis not present

## 2023-09-21 DIAGNOSIS — E7800 Pure hypercholesterolemia, unspecified: Secondary | ICD-10-CM | POA: Diagnosis not present

## 2023-09-21 NOTE — Transitions of Care (Post Inpatient/ED Visit) (Signed)
   09/21/2023  Name: [name] MRN: [id number] DOB: 1940/11/15  Today's TOC FU Call Status: Today's TOC FU Call Status:: Unsuccessful Call (1st Attempt) Unsuccessful Call (1st Attempt) Date: 09/21/23  Attempted to reach the patient regarding the most recent Inpatient/ED visit.  Follow Up Plan: Additional outreach attempts will be made to reach the patient to complete the Transitions of Care (Post Inpatient/ED visit) call.   Bronda Alfred J. Keyosha Tiedt RN, MSN [facility], [facility] RN Care Manager Direct Dial: [phone]  Fax: [phone] Website: [email or web address]

## 2023-09-24 ENCOUNTER — Telehealth: Payer: Self-pay

## 2023-09-24 NOTE — Transitions of Care (Post Inpatient/ED Visit) (Signed)
   09/24/2023  Name: LEXUS SHAMPINE MRN: [id number] DOB: July 13, 1940  Today's TOC FU Call Status: Today's TOC FU Call Status:: Successful TOC FU Call Completed TOC FU Call Complete Date: 09/24/23 Patient's Name and Date of Birth confirmed.  Transition Care Management Follow-up Telephone Call Date of Discharge: 09/20/23 Discharge Facility: Other [facility]) Name of Other (Non-[facility]) Discharge Facility: [facility] Type of Discharge: Inpatient Admission Primary Inpatient Discharge Diagnosis:: Toxic Metabolic Encephalopathy How have you been since you were released from the hospital?: Better  Items Reviewed: Medications obtained,verified, and reconciled?: No (daughter wanting to end call.) Do you have support at home?: Yes People in Home [RPT]: child(ren), adult Name of Support/Comfort Primary Source: Gwen  Medications Reviewed Today: Could not review medications. Medications Reviewed Today   Medications were not reviewed in this encounter     Spoke with daughter Fronie. She states patient is doing okay.  She states that patient is currently with [facility] with [facility] and patient was seen on 09/23/23.  Daughter declined continuing call.    Corleen Otwell J. Tywan Siever RN, MSN [facility], [facility] RN Care Manager Direct Dial: [phone]  Fax: [phone] Website: [email or web address]

## 2023-09-25 DIAGNOSIS — Z7984 Long term (current) use of oral hypoglycemic drugs: Secondary | ICD-10-CM | POA: Diagnosis not present

## 2023-09-25 DIAGNOSIS — E039 Hypothyroidism, unspecified: Secondary | ICD-10-CM | POA: Diagnosis not present

## 2023-09-25 DIAGNOSIS — S51812D Laceration without foreign body of left forearm, subsequent encounter: Secondary | ICD-10-CM | POA: Diagnosis not present

## 2023-09-25 DIAGNOSIS — E876 Hypokalemia: Secondary | ICD-10-CM | POA: Diagnosis not present

## 2023-09-25 DIAGNOSIS — E1122 Type 2 diabetes mellitus with diabetic chronic kidney disease: Secondary | ICD-10-CM | POA: Diagnosis not present

## 2023-09-25 DIAGNOSIS — S91312D Laceration without foreign body, left foot, subsequent encounter: Secondary | ICD-10-CM | POA: Diagnosis not present

## 2023-09-25 DIAGNOSIS — N189 Chronic kidney disease, unspecified: Secondary | ICD-10-CM | POA: Diagnosis not present

## 2023-09-25 DIAGNOSIS — Z556 Problems related to health literacy: Secondary | ICD-10-CM | POA: Diagnosis not present

## 2023-09-25 DIAGNOSIS — I129 Hypertensive chronic kidney disease with stage 1 through stage 4 chronic kidney disease, or unspecified chronic kidney disease: Secondary | ICD-10-CM | POA: Diagnosis not present

## 2023-09-25 DIAGNOSIS — E11649 Type 2 diabetes mellitus with hypoglycemia without coma: Secondary | ICD-10-CM | POA: Diagnosis not present

## 2023-09-25 DIAGNOSIS — K219 Gastro-esophageal reflux disease without esophagitis: Secondary | ICD-10-CM | POA: Diagnosis not present

## 2023-09-25 DIAGNOSIS — Z87891 Personal history of nicotine dependence: Secondary | ICD-10-CM | POA: Diagnosis not present

## 2023-09-25 DIAGNOSIS — E7800 Pure hypercholesterolemia, unspecified: Secondary | ICD-10-CM | POA: Diagnosis not present

## 2023-09-25 DIAGNOSIS — N390 Urinary tract infection, site not specified: Secondary | ICD-10-CM | POA: Diagnosis not present

## 2023-09-26 DIAGNOSIS — N390 Urinary tract infection, site not specified: Secondary | ICD-10-CM | POA: Diagnosis not present

## 2023-09-26 DIAGNOSIS — Z87891 Personal history of nicotine dependence: Secondary | ICD-10-CM | POA: Diagnosis not present

## 2023-09-26 DIAGNOSIS — N189 Chronic kidney disease, unspecified: Secondary | ICD-10-CM | POA: Diagnosis not present

## 2023-09-26 DIAGNOSIS — E7800 Pure hypercholesterolemia, unspecified: Secondary | ICD-10-CM | POA: Diagnosis not present

## 2023-09-26 DIAGNOSIS — S91312D Laceration without foreign body, left foot, subsequent encounter: Secondary | ICD-10-CM | POA: Diagnosis not present

## 2023-09-26 DIAGNOSIS — Z7984 Long term (current) use of oral hypoglycemic drugs: Secondary | ICD-10-CM | POA: Diagnosis not present

## 2023-09-26 DIAGNOSIS — Z556 Problems related to health literacy: Secondary | ICD-10-CM | POA: Diagnosis not present

## 2023-09-26 DIAGNOSIS — K219 Gastro-esophageal reflux disease without esophagitis: Secondary | ICD-10-CM | POA: Diagnosis not present

## 2023-09-26 DIAGNOSIS — I129 Hypertensive chronic kidney disease with stage 1 through stage 4 chronic kidney disease, or unspecified chronic kidney disease: Secondary | ICD-10-CM | POA: Diagnosis not present

## 2023-09-26 DIAGNOSIS — E11649 Type 2 diabetes mellitus with hypoglycemia without coma: Secondary | ICD-10-CM | POA: Diagnosis not present

## 2023-09-26 DIAGNOSIS — E876 Hypokalemia: Secondary | ICD-10-CM | POA: Diagnosis not present

## 2023-09-26 DIAGNOSIS — S51812D Laceration without foreign body of left forearm, subsequent encounter: Secondary | ICD-10-CM | POA: Diagnosis not present

## 2023-09-26 DIAGNOSIS — E039 Hypothyroidism, unspecified: Secondary | ICD-10-CM | POA: Diagnosis not present

## 2023-09-26 DIAGNOSIS — E1122 Type 2 diabetes mellitus with diabetic chronic kidney disease: Secondary | ICD-10-CM | POA: Diagnosis not present

## 2023-10-03 DIAGNOSIS — Z87891 Personal history of nicotine dependence: Secondary | ICD-10-CM | POA: Diagnosis not present

## 2023-10-03 DIAGNOSIS — E1122 Type 2 diabetes mellitus with diabetic chronic kidney disease: Secondary | ICD-10-CM | POA: Diagnosis not present

## 2023-10-03 DIAGNOSIS — E039 Hypothyroidism, unspecified: Secondary | ICD-10-CM | POA: Diagnosis not present

## 2023-10-03 DIAGNOSIS — E11649 Type 2 diabetes mellitus with hypoglycemia without coma: Secondary | ICD-10-CM | POA: Diagnosis not present

## 2023-10-03 DIAGNOSIS — K219 Gastro-esophageal reflux disease without esophagitis: Secondary | ICD-10-CM | POA: Diagnosis not present

## 2023-10-03 DIAGNOSIS — N189 Chronic kidney disease, unspecified: Secondary | ICD-10-CM | POA: Diagnosis not present

## 2023-10-03 DIAGNOSIS — S51812D Laceration without foreign body of left forearm, subsequent encounter: Secondary | ICD-10-CM | POA: Diagnosis not present

## 2023-10-03 DIAGNOSIS — N390 Urinary tract infection, site not specified: Secondary | ICD-10-CM | POA: Diagnosis not present

## 2023-10-03 DIAGNOSIS — Z556 Problems related to health literacy: Secondary | ICD-10-CM | POA: Diagnosis not present

## 2023-10-03 DIAGNOSIS — I129 Hypertensive chronic kidney disease with stage 1 through stage 4 chronic kidney disease, or unspecified chronic kidney disease: Secondary | ICD-10-CM | POA: Diagnosis not present

## 2023-10-03 DIAGNOSIS — E7800 Pure hypercholesterolemia, unspecified: Secondary | ICD-10-CM | POA: Diagnosis not present

## 2023-10-03 DIAGNOSIS — S91312D Laceration without foreign body, left foot, subsequent encounter: Secondary | ICD-10-CM | POA: Diagnosis not present

## 2023-10-03 DIAGNOSIS — E876 Hypokalemia: Secondary | ICD-10-CM | POA: Diagnosis not present

## 2023-10-03 DIAGNOSIS — Z7984 Long term (current) use of oral hypoglycemic drugs: Secondary | ICD-10-CM | POA: Diagnosis not present

## 2023-10-04 DIAGNOSIS — S91312D Laceration without foreign body, left foot, subsequent encounter: Secondary | ICD-10-CM | POA: Diagnosis not present

## 2023-10-04 DIAGNOSIS — Z87891 Personal history of nicotine dependence: Secondary | ICD-10-CM | POA: Diagnosis not present

## 2023-10-04 DIAGNOSIS — I129 Hypertensive chronic kidney disease with stage 1 through stage 4 chronic kidney disease, or unspecified chronic kidney disease: Secondary | ICD-10-CM | POA: Diagnosis not present

## 2023-10-04 DIAGNOSIS — S51812D Laceration without foreign body of left forearm, subsequent encounter: Secondary | ICD-10-CM | POA: Diagnosis not present

## 2023-10-04 DIAGNOSIS — E11649 Type 2 diabetes mellitus with hypoglycemia without coma: Secondary | ICD-10-CM | POA: Diagnosis not present

## 2023-10-04 DIAGNOSIS — E1122 Type 2 diabetes mellitus with diabetic chronic kidney disease: Secondary | ICD-10-CM | POA: Diagnosis not present

## 2023-10-04 DIAGNOSIS — Z7984 Long term (current) use of oral hypoglycemic drugs: Secondary | ICD-10-CM | POA: Diagnosis not present

## 2023-10-04 DIAGNOSIS — N390 Urinary tract infection, site not specified: Secondary | ICD-10-CM | POA: Diagnosis not present

## 2023-10-04 DIAGNOSIS — N189 Chronic kidney disease, unspecified: Secondary | ICD-10-CM | POA: Diagnosis not present

## 2023-10-04 DIAGNOSIS — E039 Hypothyroidism, unspecified: Secondary | ICD-10-CM | POA: Diagnosis not present

## 2023-10-04 DIAGNOSIS — Z556 Problems related to health literacy: Secondary | ICD-10-CM | POA: Diagnosis not present

## 2023-10-04 DIAGNOSIS — K219 Gastro-esophageal reflux disease without esophagitis: Secondary | ICD-10-CM | POA: Diagnosis not present

## 2023-10-04 DIAGNOSIS — E7800 Pure hypercholesterolemia, unspecified: Secondary | ICD-10-CM | POA: Diagnosis not present

## 2023-10-04 DIAGNOSIS — E876 Hypokalemia: Secondary | ICD-10-CM | POA: Diagnosis not present

## 2023-10-05 DIAGNOSIS — N189 Chronic kidney disease, unspecified: Secondary | ICD-10-CM | POA: Diagnosis not present

## 2023-10-05 DIAGNOSIS — S91312D Laceration without foreign body, left foot, subsequent encounter: Secondary | ICD-10-CM | POA: Diagnosis not present

## 2023-10-05 DIAGNOSIS — N390 Urinary tract infection, site not specified: Secondary | ICD-10-CM | POA: Diagnosis not present

## 2023-10-05 DIAGNOSIS — K219 Gastro-esophageal reflux disease without esophagitis: Secondary | ICD-10-CM | POA: Diagnosis not present

## 2023-10-05 DIAGNOSIS — Z556 Problems related to health literacy: Secondary | ICD-10-CM | POA: Diagnosis not present

## 2023-10-05 DIAGNOSIS — S51812D Laceration without foreign body of left forearm, subsequent encounter: Secondary | ICD-10-CM | POA: Diagnosis not present

## 2023-10-05 DIAGNOSIS — E039 Hypothyroidism, unspecified: Secondary | ICD-10-CM | POA: Diagnosis not present

## 2023-10-05 DIAGNOSIS — E7800 Pure hypercholesterolemia, unspecified: Secondary | ICD-10-CM | POA: Diagnosis not present

## 2023-10-05 DIAGNOSIS — Z7984 Long term (current) use of oral hypoglycemic drugs: Secondary | ICD-10-CM | POA: Diagnosis not present

## 2023-10-05 DIAGNOSIS — Z87891 Personal history of nicotine dependence: Secondary | ICD-10-CM | POA: Diagnosis not present

## 2023-10-05 DIAGNOSIS — I129 Hypertensive chronic kidney disease with stage 1 through stage 4 chronic kidney disease, or unspecified chronic kidney disease: Secondary | ICD-10-CM | POA: Diagnosis not present

## 2023-10-05 DIAGNOSIS — E1122 Type 2 diabetes mellitus with diabetic chronic kidney disease: Secondary | ICD-10-CM | POA: Diagnosis not present

## 2023-10-05 DIAGNOSIS — E876 Hypokalemia: Secondary | ICD-10-CM | POA: Diagnosis not present

## 2023-10-05 DIAGNOSIS — E11649 Type 2 diabetes mellitus with hypoglycemia without coma: Secondary | ICD-10-CM | POA: Diagnosis not present

## 2023-10-06 DIAGNOSIS — E11649 Type 2 diabetes mellitus with hypoglycemia without coma: Secondary | ICD-10-CM | POA: Diagnosis not present

## 2023-10-06 DIAGNOSIS — N390 Urinary tract infection, site not specified: Secondary | ICD-10-CM | POA: Diagnosis not present

## 2023-10-06 DIAGNOSIS — S91312D Laceration without foreign body, left foot, subsequent encounter: Secondary | ICD-10-CM | POA: Diagnosis not present

## 2023-10-06 DIAGNOSIS — E039 Hypothyroidism, unspecified: Secondary | ICD-10-CM | POA: Diagnosis not present

## 2023-10-06 DIAGNOSIS — Z7984 Long term (current) use of oral hypoglycemic drugs: Secondary | ICD-10-CM | POA: Diagnosis not present

## 2023-10-06 DIAGNOSIS — Z556 Problems related to health literacy: Secondary | ICD-10-CM | POA: Diagnosis not present

## 2023-10-06 DIAGNOSIS — Z87891 Personal history of nicotine dependence: Secondary | ICD-10-CM | POA: Diagnosis not present

## 2023-10-06 DIAGNOSIS — N189 Chronic kidney disease, unspecified: Secondary | ICD-10-CM | POA: Diagnosis not present

## 2023-10-06 DIAGNOSIS — E1122 Type 2 diabetes mellitus with diabetic chronic kidney disease: Secondary | ICD-10-CM | POA: Diagnosis not present

## 2023-10-06 DIAGNOSIS — E876 Hypokalemia: Secondary | ICD-10-CM | POA: Diagnosis not present

## 2023-10-06 DIAGNOSIS — E7800 Pure hypercholesterolemia, unspecified: Secondary | ICD-10-CM | POA: Diagnosis not present

## 2023-10-06 DIAGNOSIS — I129 Hypertensive chronic kidney disease with stage 1 through stage 4 chronic kidney disease, or unspecified chronic kidney disease: Secondary | ICD-10-CM | POA: Diagnosis not present

## 2023-10-06 DIAGNOSIS — K219 Gastro-esophageal reflux disease without esophagitis: Secondary | ICD-10-CM | POA: Diagnosis not present

## 2023-10-06 DIAGNOSIS — S51812D Laceration without foreign body of left forearm, subsequent encounter: Secondary | ICD-10-CM | POA: Diagnosis not present

## 2023-10-09 DIAGNOSIS — I129 Hypertensive chronic kidney disease with stage 1 through stage 4 chronic kidney disease, or unspecified chronic kidney disease: Secondary | ICD-10-CM | POA: Diagnosis not present

## 2023-10-09 DIAGNOSIS — E11649 Type 2 diabetes mellitus with hypoglycemia without coma: Secondary | ICD-10-CM | POA: Diagnosis not present

## 2023-10-09 DIAGNOSIS — M189 Osteoarthritis of first carpometacarpal joint, unspecified: Secondary | ICD-10-CM | POA: Diagnosis not present

## 2023-10-09 DIAGNOSIS — E1122 Type 2 diabetes mellitus with diabetic chronic kidney disease: Secondary | ICD-10-CM | POA: Diagnosis not present

## 2023-10-10 DIAGNOSIS — E039 Hypothyroidism, unspecified: Secondary | ICD-10-CM | POA: Diagnosis not present

## 2023-10-10 DIAGNOSIS — S51812D Laceration without foreign body of left forearm, subsequent encounter: Secondary | ICD-10-CM | POA: Diagnosis not present

## 2023-10-10 DIAGNOSIS — Z7984 Long term (current) use of oral hypoglycemic drugs: Secondary | ICD-10-CM | POA: Diagnosis not present

## 2023-10-10 DIAGNOSIS — S91312D Laceration without foreign body, left foot, subsequent encounter: Secondary | ICD-10-CM | POA: Diagnosis not present

## 2023-10-10 DIAGNOSIS — Z87891 Personal history of nicotine dependence: Secondary | ICD-10-CM | POA: Diagnosis not present

## 2023-10-10 DIAGNOSIS — E11649 Type 2 diabetes mellitus with hypoglycemia without coma: Secondary | ICD-10-CM | POA: Diagnosis not present

## 2023-10-10 DIAGNOSIS — N189 Chronic kidney disease, unspecified: Secondary | ICD-10-CM | POA: Diagnosis not present

## 2023-10-10 DIAGNOSIS — K219 Gastro-esophageal reflux disease without esophagitis: Secondary | ICD-10-CM | POA: Diagnosis not present

## 2023-10-10 DIAGNOSIS — I129 Hypertensive chronic kidney disease with stage 1 through stage 4 chronic kidney disease, or unspecified chronic kidney disease: Secondary | ICD-10-CM | POA: Diagnosis not present

## 2023-10-10 DIAGNOSIS — E7800 Pure hypercholesterolemia, unspecified: Secondary | ICD-10-CM | POA: Diagnosis not present

## 2023-10-10 DIAGNOSIS — E876 Hypokalemia: Secondary | ICD-10-CM | POA: Diagnosis not present

## 2023-10-10 DIAGNOSIS — Z556 Problems related to health literacy: Secondary | ICD-10-CM | POA: Diagnosis not present

## 2023-10-10 DIAGNOSIS — E1122 Type 2 diabetes mellitus with diabetic chronic kidney disease: Secondary | ICD-10-CM | POA: Diagnosis not present

## 2023-10-10 DIAGNOSIS — N390 Urinary tract infection, site not specified: Secondary | ICD-10-CM | POA: Diagnosis not present

## 2023-10-11 DIAGNOSIS — N390 Urinary tract infection, site not specified: Secondary | ICD-10-CM | POA: Diagnosis not present

## 2023-10-11 DIAGNOSIS — E039 Hypothyroidism, unspecified: Secondary | ICD-10-CM | POA: Diagnosis not present

## 2023-10-11 DIAGNOSIS — N189 Chronic kidney disease, unspecified: Secondary | ICD-10-CM | POA: Diagnosis not present

## 2023-10-11 DIAGNOSIS — E1122 Type 2 diabetes mellitus with diabetic chronic kidney disease: Secondary | ICD-10-CM | POA: Diagnosis not present

## 2023-10-11 DIAGNOSIS — E876 Hypokalemia: Secondary | ICD-10-CM | POA: Diagnosis not present

## 2023-10-11 DIAGNOSIS — S51812D Laceration without foreign body of left forearm, subsequent encounter: Secondary | ICD-10-CM | POA: Diagnosis not present

## 2023-10-11 DIAGNOSIS — Z87891 Personal history of nicotine dependence: Secondary | ICD-10-CM | POA: Diagnosis not present

## 2023-10-11 DIAGNOSIS — E7800 Pure hypercholesterolemia, unspecified: Secondary | ICD-10-CM | POA: Diagnosis not present

## 2023-10-11 DIAGNOSIS — Z556 Problems related to health literacy: Secondary | ICD-10-CM | POA: Diagnosis not present

## 2023-10-11 DIAGNOSIS — K219 Gastro-esophageal reflux disease without esophagitis: Secondary | ICD-10-CM | POA: Diagnosis not present

## 2023-10-11 DIAGNOSIS — S91312D Laceration without foreign body, left foot, subsequent encounter: Secondary | ICD-10-CM | POA: Diagnosis not present

## 2023-10-11 DIAGNOSIS — I129 Hypertensive chronic kidney disease with stage 1 through stage 4 chronic kidney disease, or unspecified chronic kidney disease: Secondary | ICD-10-CM | POA: Diagnosis not present

## 2023-10-11 DIAGNOSIS — E11649 Type 2 diabetes mellitus with hypoglycemia without coma: Secondary | ICD-10-CM | POA: Diagnosis not present

## 2023-10-11 DIAGNOSIS — Z7984 Long term (current) use of oral hypoglycemic drugs: Secondary | ICD-10-CM | POA: Diagnosis not present

## 2023-10-16 DIAGNOSIS — E7800 Pure hypercholesterolemia, unspecified: Secondary | ICD-10-CM | POA: Diagnosis not present

## 2023-10-16 DIAGNOSIS — S91312D Laceration without foreign body, left foot, subsequent encounter: Secondary | ICD-10-CM | POA: Diagnosis not present

## 2023-10-16 DIAGNOSIS — I129 Hypertensive chronic kidney disease with stage 1 through stage 4 chronic kidney disease, or unspecified chronic kidney disease: Secondary | ICD-10-CM | POA: Diagnosis not present

## 2023-10-16 DIAGNOSIS — Z87891 Personal history of nicotine dependence: Secondary | ICD-10-CM | POA: Diagnosis not present

## 2023-10-16 DIAGNOSIS — E11649 Type 2 diabetes mellitus with hypoglycemia without coma: Secondary | ICD-10-CM | POA: Diagnosis not present

## 2023-10-16 DIAGNOSIS — S51812D Laceration without foreign body of left forearm, subsequent encounter: Secondary | ICD-10-CM | POA: Diagnosis not present

## 2023-10-16 DIAGNOSIS — Z556 Problems related to health literacy: Secondary | ICD-10-CM | POA: Diagnosis not present

## 2023-10-16 DIAGNOSIS — E876 Hypokalemia: Secondary | ICD-10-CM | POA: Diagnosis not present

## 2023-10-16 DIAGNOSIS — N390 Urinary tract infection, site not specified: Secondary | ICD-10-CM | POA: Diagnosis not present

## 2023-10-16 DIAGNOSIS — Z7984 Long term (current) use of oral hypoglycemic drugs: Secondary | ICD-10-CM | POA: Diagnosis not present

## 2023-10-16 DIAGNOSIS — E039 Hypothyroidism, unspecified: Secondary | ICD-10-CM | POA: Diagnosis not present

## 2023-10-16 DIAGNOSIS — N189 Chronic kidney disease, unspecified: Secondary | ICD-10-CM | POA: Diagnosis not present

## 2023-10-16 DIAGNOSIS — E1122 Type 2 diabetes mellitus with diabetic chronic kidney disease: Secondary | ICD-10-CM | POA: Diagnosis not present

## 2023-10-16 DIAGNOSIS — K219 Gastro-esophageal reflux disease without esophagitis: Secondary | ICD-10-CM | POA: Diagnosis not present

## 2023-10-17 DIAGNOSIS — Z7984 Long term (current) use of oral hypoglycemic drugs: Secondary | ICD-10-CM | POA: Diagnosis not present

## 2023-10-17 DIAGNOSIS — E7800 Pure hypercholesterolemia, unspecified: Secondary | ICD-10-CM | POA: Diagnosis not present

## 2023-10-17 DIAGNOSIS — I129 Hypertensive chronic kidney disease with stage 1 through stage 4 chronic kidney disease, or unspecified chronic kidney disease: Secondary | ICD-10-CM | POA: Diagnosis not present

## 2023-10-17 DIAGNOSIS — E1122 Type 2 diabetes mellitus with diabetic chronic kidney disease: Secondary | ICD-10-CM | POA: Diagnosis not present

## 2023-10-17 DIAGNOSIS — S51812D Laceration without foreign body of left forearm, subsequent encounter: Secondary | ICD-10-CM | POA: Diagnosis not present

## 2023-10-17 DIAGNOSIS — E039 Hypothyroidism, unspecified: Secondary | ICD-10-CM | POA: Diagnosis not present

## 2023-10-17 DIAGNOSIS — K219 Gastro-esophageal reflux disease without esophagitis: Secondary | ICD-10-CM | POA: Diagnosis not present

## 2023-10-17 DIAGNOSIS — S91312D Laceration without foreign body, left foot, subsequent encounter: Secondary | ICD-10-CM | POA: Diagnosis not present

## 2023-10-17 DIAGNOSIS — Z556 Problems related to health literacy: Secondary | ICD-10-CM | POA: Diagnosis not present

## 2023-10-17 DIAGNOSIS — Z87891 Personal history of nicotine dependence: Secondary | ICD-10-CM | POA: Diagnosis not present

## 2023-10-17 DIAGNOSIS — E876 Hypokalemia: Secondary | ICD-10-CM | POA: Diagnosis not present

## 2023-10-17 DIAGNOSIS — E11649 Type 2 diabetes mellitus with hypoglycemia without coma: Secondary | ICD-10-CM | POA: Diagnosis not present

## 2023-10-17 DIAGNOSIS — N189 Chronic kidney disease, unspecified: Secondary | ICD-10-CM | POA: Diagnosis not present

## 2023-10-17 DIAGNOSIS — N390 Urinary tract infection, site not specified: Secondary | ICD-10-CM | POA: Diagnosis not present

## 2023-10-18 DIAGNOSIS — E1122 Type 2 diabetes mellitus with diabetic chronic kidney disease: Secondary | ICD-10-CM | POA: Diagnosis not present

## 2023-10-18 DIAGNOSIS — E7800 Pure hypercholesterolemia, unspecified: Secondary | ICD-10-CM | POA: Diagnosis not present

## 2023-10-18 DIAGNOSIS — N390 Urinary tract infection, site not specified: Secondary | ICD-10-CM | POA: Diagnosis not present

## 2023-10-18 DIAGNOSIS — I129 Hypertensive chronic kidney disease with stage 1 through stage 4 chronic kidney disease, or unspecified chronic kidney disease: Secondary | ICD-10-CM | POA: Diagnosis not present

## 2023-10-18 DIAGNOSIS — E11649 Type 2 diabetes mellitus with hypoglycemia without coma: Secondary | ICD-10-CM | POA: Diagnosis not present

## 2023-10-18 DIAGNOSIS — E876 Hypokalemia: Secondary | ICD-10-CM | POA: Diagnosis not present

## 2023-10-18 DIAGNOSIS — Z556 Problems related to health literacy: Secondary | ICD-10-CM | POA: Diagnosis not present

## 2023-10-18 DIAGNOSIS — E039 Hypothyroidism, unspecified: Secondary | ICD-10-CM | POA: Diagnosis not present

## 2023-10-18 DIAGNOSIS — S91312D Laceration without foreign body, left foot, subsequent encounter: Secondary | ICD-10-CM | POA: Diagnosis not present

## 2023-10-18 DIAGNOSIS — N189 Chronic kidney disease, unspecified: Secondary | ICD-10-CM | POA: Diagnosis not present

## 2023-10-18 DIAGNOSIS — Z87891 Personal history of nicotine dependence: Secondary | ICD-10-CM | POA: Diagnosis not present

## 2023-10-18 DIAGNOSIS — S51812D Laceration without foreign body of left forearm, subsequent encounter: Secondary | ICD-10-CM | POA: Diagnosis not present

## 2023-10-18 DIAGNOSIS — Z7984 Long term (current) use of oral hypoglycemic drugs: Secondary | ICD-10-CM | POA: Diagnosis not present

## 2023-10-18 DIAGNOSIS — K219 Gastro-esophageal reflux disease without esophagitis: Secondary | ICD-10-CM | POA: Diagnosis not present

## 2023-10-22 DIAGNOSIS — I129 Hypertensive chronic kidney disease with stage 1 through stage 4 chronic kidney disease, or unspecified chronic kidney disease: Secondary | ICD-10-CM | POA: Diagnosis not present

## 2023-10-22 DIAGNOSIS — N1831 Chronic kidney disease, stage 3a: Secondary | ICD-10-CM | POA: Diagnosis not present

## 2023-10-22 DIAGNOSIS — Z87440 Personal history of urinary (tract) infections: Secondary | ICD-10-CM | POA: Diagnosis not present

## 2023-10-22 DIAGNOSIS — Z13228 Encounter for screening for other metabolic disorders: Secondary | ICD-10-CM | POA: Diagnosis not present

## 2023-10-22 DIAGNOSIS — Z7689 Persons encountering health services in other specified circumstances: Secondary | ICD-10-CM | POA: Diagnosis not present

## 2023-10-22 DIAGNOSIS — Z794 Long term (current) use of insulin: Secondary | ICD-10-CM | POA: Diagnosis not present

## 2023-10-22 DIAGNOSIS — I10 Essential (primary) hypertension: Secondary | ICD-10-CM | POA: Diagnosis not present

## 2023-10-22 DIAGNOSIS — E1165 Type 2 diabetes mellitus with hyperglycemia: Secondary | ICD-10-CM | POA: Diagnosis not present
